# Patient Record
Sex: Male | Born: 1970 | Race: White | Hispanic: No | Smoking: Never smoker
Health system: Southern US, Community
[De-identification: ages and names within clinical notes are randomized; demographics above are authoritative.]

## PROBLEM LIST (undated history)

## (undated) DIAGNOSIS — J45909 Unspecified asthma, uncomplicated: Secondary | ICD-10-CM

## (undated) DIAGNOSIS — J449 Chronic obstructive pulmonary disease, unspecified: Secondary | ICD-10-CM

## (undated) DIAGNOSIS — I10 Essential (primary) hypertension: Secondary | ICD-10-CM

---

## 2001-03-29 ENCOUNTER — Emergency Department (HOSPITAL_COMMUNITY): Admission: EM | Admit: 2001-03-29 | Discharge: 2001-03-29 | Payer: Self-pay | Admitting: Emergency Medicine

## 2003-12-29 ENCOUNTER — Emergency Department (HOSPITAL_COMMUNITY): Admission: EM | Admit: 2003-12-29 | Discharge: 2003-12-29 | Payer: Self-pay | Admitting: Emergency Medicine

## 2004-12-11 ENCOUNTER — Emergency Department (HOSPITAL_COMMUNITY): Admission: EM | Admit: 2004-12-11 | Discharge: 2004-12-11 | Payer: Self-pay | Admitting: Emergency Medicine

## 2006-09-04 ENCOUNTER — Emergency Department (HOSPITAL_COMMUNITY): Admission: EM | Admit: 2006-09-04 | Discharge: 2006-09-04 | Payer: Self-pay | Admitting: Emergency Medicine

## 2010-01-23 ENCOUNTER — Emergency Department (HOSPITAL_COMMUNITY): Admission: EM | Admit: 2010-01-23 | Discharge: 2010-01-23 | Payer: Self-pay | Admitting: Emergency Medicine

## 2010-07-05 ENCOUNTER — Emergency Department (HOSPITAL_COMMUNITY)
Admission: EM | Admit: 2010-07-05 | Discharge: 2010-07-05 | Payer: Self-pay | Source: Home / Self Care | Admitting: Emergency Medicine

## 2017-03-22 ENCOUNTER — Encounter (HOSPITAL_COMMUNITY): Payer: Self-pay | Admitting: Family Medicine

## 2017-03-22 ENCOUNTER — Ambulatory Visit (HOSPITAL_COMMUNITY)
Admission: EM | Admit: 2017-03-22 | Discharge: 2017-03-22 | Disposition: A | Discharge status: Home / Self Care | Payer: Self-pay | Attending: Internal Medicine | Admitting: Internal Medicine

## 2017-03-22 DIAGNOSIS — W57XXXA Bitten or stung by nonvenomous insect and other nonvenomous arthropods, initial encounter: Secondary | ICD-10-CM

## 2017-03-22 DIAGNOSIS — S30860A Insect bite (nonvenomous) of lower back and pelvis, initial encounter: Secondary | ICD-10-CM

## 2017-03-22 HISTORY — DX: Unspecified asthma, uncomplicated: J45.909

## 2017-03-22 HISTORY — DX: Chronic obstructive pulmonary disease, unspecified: J44.9

## 2017-03-22 MED ORDER — DOXYCYCLINE HYCLATE 100 MG PO TABS
200.0000 mg | ORAL_TABLET | Freq: Once | ORAL | Status: AC
  Administered 2017-03-22: 200 mg via ORAL

## 2017-03-22 MED ORDER — DOXYCYCLINE HYCLATE 100 MG PO TABS
ORAL_TABLET | ORAL | Status: AC
  Filled 2017-03-22: qty 2

## 2017-03-22 NOTE — ED Provider Notes (Signed)
CSN: [id number]     Arrival date & time 03/22/17  1039 History   None    Chief Complaint  Patient presents with  . Insect Bite   (Consider location/radiation/quality/duration/timing/severity/associated sxs/prior Treatment) 46 year old male comes in with 2 day history of a tick bite on the back. He states that he felt a bump on his back 4 days ago that is pruritic. 3 days ago his mother checked his back and realized it was a tick attached. Mother removed tick with tweezers. The patient states that he thinks the tick has been on for 24 hours. Patient states he's been having some local reaction to the tick bite with redness and swelling. Denies any fever, chills, night sweats. Denies malaise, body aches, joint pain. Denies nausea, vomiting, and diarrhea. Denies rash.       Past Medical History:  Diagnosis Date  . Asthma   . COPD (chronic obstructive pulmonary disease) (HCC)    History reviewed. No pertinent surgical history. History reviewed. No pertinent family history. Social History  Substance Use Topics  . Smoking status: Never Smoker  . Smokeless tobacco: Never Used  . Alcohol use Not on file    Review of Systems  Constitutional: Negative for chills, diaphoresis, fatigue and fever.  HENT: Negative for congestion, rhinorrhea, sinus pain, sinus pressure and sore throat.   Eyes: Negative for pain, discharge, redness and itching.  Respiratory: Negative for cough, shortness of breath and wheezing.   Cardiovascular: Negative for chest pain and palpitations.  Gastrointestinal: Negative for abdominal pain, diarrhea, nausea and vomiting.  Musculoskeletal: Negative for arthralgias and myalgias.  Skin: Negative for rash.  Neurological: Negative for dizziness, tremors, weakness, light-headedness, numbness and headaches.    Allergies  Patient has no known allergies.  Home Medications   Prior to Admission medications   Not on File   Meds Ordered and Administered this Visit    Medications  doxycycline (VIBRA-TABS) tablet 200 mg (not administered)    BP 119/74   Pulse 81   Temp 98.5 F (36.9 C)   Resp 18   SpO2 95%  No data found.   Physical Exam  Constitutional: He is oriented to person, place, and time. He appears well-developed and well-nourished. No distress.  HENT:  Head: Normocephalic and atraumatic.  Eyes: Conjunctivae are normal. Pupils are equal, round, and reactive to light.  Cardiovascular: Normal rate, regular rhythm and normal heart sounds.  Exam reveals no gallop and no friction rub.   No murmur heard. Pulmonary/Chest: Effort normal and breath sounds normal. He has no wheezes. He has no rales.  Lymphadenopathy:    He has no cervical adenopathy.  Neurological: He is alert and oriented to person, place, and time.  Skin: Skin is warm and dry.  Psychiatric: He has a normal mood and affect. His behavior is normal. Judgment normal.    Urgent Care Course     Procedures (including critical care time)  Labs Review Labs Reviewed - No data to display  Imaging Review No results found.      MDM   1. Tick bite, initial encounter    1. Discussed with patient incubation period of tick disease. Given incubation period, lab draws is not recommended at this time. Given the patient with at least 24 hours of tick bite attachment, with possible engorged tick, will treat with prophylactic dose of doxycycline 200 mg one dose. Patient to monitor for any symptom changes such as fever, malaise, body aches, joint pain, abdominal pain, or rash. Patient  to follow up with PCP if experiencing any of the symptoms for further workup.    Belinda Fisher, PA-C 03/22/17 1141

## 2017-03-22 NOTE — ED Triage Notes (Signed)
Pt here for tick bite. sts he feels okay but worried about the type of tick .

## 2018-09-29 ENCOUNTER — Encounter: Payer: Self-pay | Admitting: Cardiology

## 2018-09-29 ENCOUNTER — Ambulatory Visit (INDEPENDENT_AMBULATORY_CARE_PROVIDER_SITE_OTHER): Payer: Medicare Other | Admitting: Cardiology

## 2018-09-29 VITALS — BP 126/60 | HR 76 | Ht 71.0 in | Wt 390.7 lb

## 2018-09-29 DIAGNOSIS — E088 Diabetes mellitus due to underlying condition with unspecified complications: Secondary | ICD-10-CM

## 2018-09-29 DIAGNOSIS — I10 Essential (primary) hypertension: Secondary | ICD-10-CM

## 2018-09-29 DIAGNOSIS — I209 Angina pectoris, unspecified: Secondary | ICD-10-CM | POA: Diagnosis not present

## 2018-09-29 HISTORY — DX: Essential (primary) hypertension: I10

## 2018-09-29 HISTORY — DX: Morbid (severe) obesity due to excess calories: E66.01

## 2018-09-29 HISTORY — DX: Diabetes mellitus due to underlying condition with unspecified complications: E08.8

## 2018-09-29 MED ORDER — NITROGLYCERIN 0.4 MG SL SUBL: 0.4000 mg | SUBLINGUAL_TABLET | SUBLINGUAL | 11 refills | Status: DC | PRN

## 2018-09-29 NOTE — H&P (View-Only) (Signed)
Cardiology Office Note:    Date:  09/29/2018   ID:  Charles Esparza, DOB October 19, 1970, MRN [id number]  PCP:  Charles Esparza, Charles Edward, MD  Cardiologist:  Charles Brothersajan R Henlee Donovan, MD   Referring MD: Charles Esparza, Charles Esparza,*    ASSESSMENT:    1. Angina pectoris (HCC)   2. Morbid obesity (HCC)   3. Essential hypertension   4. Diabetes mellitus due to underlying condition with unspecified complications (HCC)    PLAN:    In order of problems listed above:  1. Primary prevention stressed with the patient.  Importance of compliance with diet and medications stressed with the patient.  His blood pressure is stable and diet was discussed for dyslipidemia and diabetes mellitus.  Importance of diet stressed.  Risks of obesity explained at extensive length and he vocalized understanding. 2. His symptoms are very concerning.In view of the patient's symptoms, I discussed with the patient options for evaluation. Invasive and noninvasive options were given to the patient. I discussed stress testing and coronary angiography and left heart catheterization at length. Benefits, pros and cons of each approach were discussed at length. Patient had multiple questions which were answered to the patient's satisfaction. Patient opted for invasive evaluation and we will set up for coronary angiography and left heart catheterization. Further recommendations will be made based on the findings with coronary angiography. In the interim if the patient has any significant symptoms in hospital to the nearest emergency room. 3. Sublingual nitroglycerin prescription was sent, its protocol and 911 protocol explained and the patient vocalized understanding questions were answered to the patient's satisfaction   Medication Adjustments/Labs and Tests Ordered: Current medicines are reviewed at length with the patient today.  Concerns regarding medicines are outlined above.  No orders of the defined types were placed in this  encounter.  No orders of the defined types were placed in this encounter.    History of Present Illness:    Charles Esparza is a 48 y.o. male who is being seen today for the evaluation of chest tightness on exertion at the request of Charles Miyamotoerry, Charles Esparza,*.  Patient is a pleasant 48 year old male.  He has past medical history of essential hypertension and is morbidly obese.  The patient mentions to me that he has chest tightness on exertion.  This goes to the neck and occasionally to the arms.  He leads a sedentary lifestyle.  He mentions to me that when he is sexually active that he has similar symptoms.  No orthopnea or PND.  Because of the symptoms he is here for an evaluation.  His significant other is with him during this visit and agrees.  At the time of my evaluation, the patient is alert awake oriented and in no distress.  Past Medical History:  Diagnosis Date  . Asthma   . COPD (chronic obstructive pulmonary disease) (HCC)     History reviewed. No pertinent surgical history.  Current Medications: Current Meds  Medication Sig  . aspirin EC 81 MG tablet Take 81 mg by mouth daily.  . furosemide (LASIX) 20 MG tablet Take 20 mg by mouth daily.  Marland Kitchen. lisinopril (PRINIVIL,ZESTRIL) 20 MG tablet Take 1 tablet by mouth daily.  Marland Kitchen. LORazepam (ATIVAN) 0.5 MG tablet Take 0.5 mg by mouth every 8 (eight) hours.  . metoprolol tartrate (LOPRESSOR) 50 MG tablet Take 1 tablet by mouth 2 (two) times daily.     Allergies:   Patient has no known allergies.   Social History   Socioeconomic  History  . Marital status: Married    Spouse name: Not on file  . Number of children: Not on file  . Years of education: Not on file  . Highest education level: Not on file  Occupational History  . Not on file  Social Needs  . Financial resource strain: Not on file  . Food insecurity:    Worry: Not on file    Inability: Not on file  . Transportation needs:    Medical: Not on file    Non-medical: Not  on file  Tobacco Use  . Smoking status: Never Smoker  . Smokeless tobacco: Never Used  Substance and Sexual Activity  . Alcohol use: Not on file  . Drug use: Not on file  . Sexual activity: Not on file  Lifestyle  . Physical activity:    Days per week: Not on file    Minutes per session: Not on file  . Stress: Not on file  Relationships  . Social connections:    Talks on phone: Not on file    Gets together: Not on file    Attends religious service: Not on file    Active member of club or organization: Not on file    Attends meetings of clubs or organizations: Not on file    Relationship status: Not on file  Other Topics Concern  . Not on file  Social History Narrative  . Not on file     Family History: The patient's family history is not on file.  ROS:   Please see the history of present illness.    All other systems reviewed and are negative.  EKGs/Labs/Other Studies Reviewed:    The following studies were reviewed today: I discussed my findings with the patient.  EKG reveals sinus rhythm and nonspecific ST-T changes.   Recent Labs: No results found for requested labs within last 8760 hours.  Recent Lipid Panel No results found for: CHOL, TRIG, HDL, CHOLHDL, VLDL, LDLCALC, LDLDIRECT  Physical Exam:    VS:  BP 126/60 (BP Location: Right Arm, Patient Position: Sitting, Cuff Size: Normal)   Pulse 76   Ht 5\' 11"  (1.803 m)   Wt (!) 390 lb 11.2 oz (177.2 kg)   SpO2 98%   BMI 54.49 kg/m     Wt Readings from Last 3 Encounters:  09/29/18 (!) 390 lb 11.2 oz (177.2 kg)     GEN: Patient is in no acute distress HEENT: Normal NECK: No JVD; No carotid bruits LYMPHATICS: No lymphadenopathy CARDIAC: S1 S2 regular, 2/6 systolic murmur at the apex. RESPIRATORY:  Clear to auscultation without rales, wheezing or rhonchi  ABDOMEN: Soft, non-tender, non-distended MUSCULOSKELETAL:  No edema; No deformity  SKIN: Warm and dry NEUROLOGIC:  Alert and oriented x  3 PSYCHIATRIC:  Normal affect    Signed, Charles Brothers, MD  09/29/2018 3:35 PM    [facility]

## 2018-09-29 NOTE — Patient Instructions (Signed)
Medication Instructions:   Your physician has recommended you make the following change in your medication:   TAKE AS NEEDED FOR CHEST PAIN.   When having chest pain, stop what you are doing and sit down. Take 1 nitro, wait 5 minutes. Still having chest pain, take 1 nitro, wait 5 minutes. Still having chest pain, take 1 nitro, dial 911. Total of 3 nitro in 15 minutes.    If you need a refill on your cardiac medications before your next appointment, please call your pharmacy.   Lab work: Your physician recommends that you return for lab work today: CBC, BMP  If you have labs (blood work) drawn today and your tests are completely normal, you will receive your results only by: Marland Kitchen. MyChart Message (if you have MyChart) OR . A paper copy in the mail If you have any lab test that is abnormal or we need to change your treatment, we will call you to review the results.  Testing/Procedures:  A chest x-ray takes a picture of the organs and structures inside the chest, including the heart, lungs, and blood vessels. This test can show several things, including, whether the heart is enlarges; whether fluid is building up in the lungs; and whether pacemaker / defibrillator leads are still in place.    [facility] [place] [address] [place] [id number] Dept: [phone] Loc: [phone]  Denna HaggardRaymond P Swoboda  09/29/2018  You are scheduled for a Cardiac Catheterization on Thursday, January 16 with Dr. Cristal Deerhristopher End.  1. [facility]: [address] [place], [place] [address] at 7:00 AM (This time is two hours before your procedure to ensure your preparation). Free valet parking service is available.   Special note: Every effort is made to have your procedure done on time. Please understand that emergencies sometimes delay scheduled procedures.  2. Diet: Do not  eat solid foods after midnight.  The patient may have clear liquids until 5am upon the day of the procedure.  3. Labs: You will need to have blood drawn today.  4. Medication instructions in preparation for your procedure:   Contrast Allergy: No    HOLD LASIX THE DAY OF PROCEDURE.     On the morning of your procedure, take your Aspirin and any morning medicines NOT listed above.  You may use sips of water.  5. Plan for one night stay--bring personal belongings. 6. Bring a current list of your medications and current insurance cards. 7. You MUST have a responsible person to drive you home. 8. Someone MUST be with you the first 24 hours after you arrive home or your discharge will be delayed. 9. Please wear clothes that are easy to get on and off and wear slip-on shoes.  Thank you for allowing us to care for you!   -- [facility]   Follow-Up: At [facility], you and your health needs are our priority.  As part of our continuing mission to provide you with exceptional heart care, we have created designated Provider Care Teams.  These Care Teams include your primary Cardiologist (physician) and Advanced Practice Providers (APPs -  Physician Assistants and Nurse Practitioners) who all work together to provide you with the care you need, when you need it.  . You  will need a follow up appointment in 1 months.    Any Other Special Instructions Will  Be Listed Below (If Applicable).  Coronary Angiogram With Stent Coronary angiogram with stent placement is a procedure to widen or open a narrow blood vessel of the heart (coronary artery). Arteries may become blocked by cholesterol buildup (plaques) in the lining of the wall. When a coronary artery becomes partially blocked, blood flow to that area decreases. This may lead to chest pain or a heart attack (myocardial infarction). A stent is a small piece of metal that looks like mesh or a spring. Stent  placement may be done as treatment for a heart attack or right after a coronary angiogram in which a blocked artery is found. Let your health care provider know about:  Any allergies you have.  All medicines you are taking, including vitamins, herbs, eye drops, creams, and over-the-counter medicines.  Any problems you or family members have had with anesthetic medicines.  Any blood disorders you have.  Any surgeries you have had.  Any medical conditions you have.  Whether you are pregnant or may be pregnant. What are the risks? Generally, this is a safe procedure. However, problems may occur, including:  Damage to the heart or its blood vessels.  A return of blockage.  Bleeding, infection, or bruising at the insertion site.  A collection of blood under the skin (hematoma) at the insertion site.  A blood clot in another part of the body.  Kidney injury.  Allergic reaction to the dye or contrast that is used.  Bleeding into the abdomen (retroperitoneal bleeding). What happens before the procedure? Staying hydrated Follow instructions from your health care provider about hydration, which may include:  Up to 2 hours before the procedure - you may continue to drink clear liquids, such as water, clear fruit juice, black coffee, and plain tea.  Eating and drinking restrictions Follow instructions from your health care provider about eating and drinking, which may include:  8 hours before the procedure - stop eating heavy meals or foods such as meat, fried foods, or fatty foods.  6 hours before the procedure - stop eating light meals or foods, such as toast or cereal.  2 hours before the procedure - stop drinking clear liquids. Ask your health care provider about:  Changing or stopping your regular medicines. This is especially important if you are taking diabetes medicines or blood thinners.  Taking medicines such as ibuprofen. These medicines can thin your blood. Do not  take these medicines before your procedure if your health care provider instructs you not to. Generally, aspirin is recommended before a procedure of passing a small, thin tube (catheter) through a blood vessel and into the heart (cardiac catheterization). What happens during the procedure?   An IV tube will be inserted into one of your veins.  You will be given one or more of the following: ? A medicine to help you relax (sedative). ? A medicine to numb the area where the catheter will be inserted into an artery (local anesthetic).  To reduce your risk of infection: ? Your health care team will wash or sanitize their hands. ? Your skin will be washed with soap. ? Hair may be removed from the area where the catheter will be inserted.  Using a guide wire, the catheter will be inserted into an artery. The location may be in your groin, in your wrist, or in the fold of your arm (near your elbow).  A type of X-ray (fluoroscopy) will be used to help guide the catheter to the opening of the  arteries in the heart.  A dye will be injected into the catheter, and X-rays will be taken. The dye will help to show where any narrowing or blockages are located in the arteries.  A tiny wire will be guided to the blocked spot, and a balloon will be inflated to make the artery wider.  The stent will be expanded and will crush the plaques into the wall of the vessel. The stent will hold the area open and improve the blood flow. Most stents have a drug coating to reduce the risk of the stent narrowing over time.  The artery may be made wider using a drill, laser, or other tools to remove plaques.  When the blood flow is better, the catheter will be removed. The lining of the artery will grow over the stent, which stays where it was placed. This procedure may vary among health care providers and hospitals. What happens after the procedure?  If the procedure is done through the leg, you will be kept in bed  lying flat for about 6 hours. You will be instructed to not bend and not cross your legs.  The insertion site will be checked frequently.  The pulse in your foot or wrist will be checked frequently.  You may have additional blood tests, X-rays, and a test that records the electrical activity of your heart (electrocardiogram, or ECG). This information is not intended to replace advice given to you by your health care provider. Make sure you discuss any questions you have with your health care provider. Document Released: 03/10/2003 Document Revised: 12/13/2017 Document Reviewed: 04/08/2016 Elsevier Interactive Patient Education  2019 Elsevier Inc.  Nitroglycerin sublingual tablets What is this medicine? NITROGLYCERIN (nye troe GLI ser in) is a type of vasodilator. It relaxes blood vessels, increasing the blood and oxygen supply to your heart. This medicine is used to relieve chest pain caused by angina. It is also used to prevent chest pain before activities like climbing stairs, going outdoors in cold weather, or sexual activity. This medicine may be used for other purposes; ask your health care provider or pharmacist if you have questions. COMMON BRAND NAME(S): Nitroquick, Nitrostat, Nitrotab What should I tell my health care provider before I take this medicine? They need to know if you have any of these conditions: -anemia -head injury, recent stroke, or bleeding in the brain -liver disease -previous heart attack -an unusual or allergic reaction to nitroglycerin, other medicines, foods, dyes, or preservatives -pregnant or trying to get pregnant -breast-feeding How should I use this medicine? Take this medicine by mouth as needed. At the first sign of an angina attack (chest pain or tightness) place one tablet under your tongue. You can also take this medicine 5 to 10 minutes before an event likely to produce chest pain. Follow the directions on the prescription label. Let the tablet  dissolve under the tongue. Do not swallow whole. Replace the dose if you accidentally swallow it. It will help if your mouth is not dry. Saliva around the tablet will help it to dissolve more quickly. Do not eat or drink, smoke or chew tobacco while a tablet is dissolving. If you are not better within 5 minutes after taking ONE dose of nitroglycerin, call 9-1-1 immediately to seek emergency medical care. Do not take more than 3 nitroglycerin tablets over 15 minutes. If you take this medicine often to relieve symptoms of angina, your doctor or health care professional may provide you with different instructions to manage your  symptoms. If symptoms do not go away after following these instructions, it is important to call 9-1-1 immediately. Do not take more than 3 nitroglycerin tablets over 15 minutes. Talk to your pediatrician regarding the use of this medicine in children. Special care may be needed. Overdosage: If you think you have taken too much of this medicine contact a poison control center or emergency room at once. NOTE: This medicine is only for you. Do not share this medicine with others. What if I miss a dose? This does not apply. This medicine is only used as needed. What may interact with this medicine? Do not take this medicine with any of the following medications: -certain migraine medicines like ergotamine and dihydroergotamine (DHE) -medicines used to treat erectile dysfunction like sildenafil, tadalafil, and vardenafil -riociguat This medicine may also interact with the following medications: -alteplase -aspirin -heparin -medicines for high blood pressure -medicines for mental depression -other medicines used to treat angina -phenothiazines like chlorpromazine, mesoridazine, prochlorperazine, thioridazine This list may not describe all possible interactions. Give your health care provider a list of all the medicines, herbs, non-prescription drugs, or dietary supplements you  use. Also tell them if you smoke, drink alcohol, or use illegal drugs. Some items may interact with your medicine. What should I watch for while using this medicine? Tell your doctor or health care professional if you feel your medicine is no longer working. Keep this medicine with you at all times. Sit or lie down when you take your medicine to prevent falling if you feel dizzy or faint after using it. Try to remain calm. This will help you to feel better faster. If you feel dizzy, take several deep breaths and lie down with your feet propped up, or bend forward with your head resting between your knees. You may get drowsy or dizzy. Do not drive, use machinery, or do anything that needs mental alertness until you know how this drug affects you. Do not stand or sit up quickly, especially if you are an older patient. This reduces the risk of dizzy or fainting spells. Alcohol can make you more drowsy and dizzy. Avoid alcoholic drinks. Do not treat yourself for coughs, colds, or pain while you are taking this medicine without asking your doctor or health care professional for advice. Some ingredients may increase your blood pressure. What side effects may I notice from receiving this medicine? Side effects that you should report to your doctor or health care professional as soon as possible: -blurred vision -dry mouth -skin rash -sweating -the feeling of extreme pressure in the head -unusually weak or tired Side effects that usually do not require medical attention (report to your doctor or health care professional if they continue or are bothersome): -flushing of the face or neck -headache -irregular heartbeat, palpitations -nausea, vomiting This list may not describe all possible side effects. Call your doctor for medical advice about side effects. You may report side effects to FDA at 1-800-FDA-1088. Where should I keep my medicine? Keep out of the reach of children. Store at room temperature  between 20 and 25 degrees C (68 and 77 degrees F). Store in Retail buyeroriginal container. Protect from light and moisture. Keep tightly closed. Throw away any unused medicine after the expiration date. NOTE: This sheet is a summary. It may not cover all possible information. If you have questions about this medicine, talk to your doctor, pharmacist, or health care provider.  2019 Elsevier/Gold Standard (2013-07-02 17:57:36)

## 2018-09-29 NOTE — Progress Notes (Signed)
Cardiology Office Note:    Date:  09/29/2018   ID:  Charles Esparza, DOB 05/22/1971, MRN [id number]  PCP:  Perry, Lawrence Edward, MD  Cardiologist:  Rajan R Revankar, MD   Referring MD: Perry, Lawrence Edward,*    ASSESSMENT:    1. Angina pectoris (HCC)   2. Morbid obesity (HCC)   3. Essential hypertension   4. Diabetes mellitus due to underlying condition with unspecified complications (HCC)    PLAN:    In order of problems listed above:  1. Primary prevention stressed with the patient.  Importance of compliance with diet and medications stressed with the patient.  His blood pressure is stable and diet was discussed for dyslipidemia and diabetes mellitus.  Importance of diet stressed.  Risks of obesity explained at extensive length and he vocalized understanding. 2. His symptoms are very concerning.In view of the patient's symptoms, I discussed with the patient options for evaluation. Invasive and noninvasive options were given to the patient. I discussed stress testing and coronary angiography and left heart catheterization at length. Benefits, pros and cons of each approach were discussed at length. Patient had multiple questions which were answered to the patient's satisfaction. Patient opted for invasive evaluation and we will set up for coronary angiography and left heart catheterization. Further recommendations will be made based on the findings with coronary angiography. In the interim if the patient has any significant symptoms in hospital to the nearest emergency room. 3. Sublingual nitroglycerin prescription was sent, its protocol and 911 protocol explained and the patient vocalized understanding questions were answered to the patient's satisfaction   Medication Adjustments/Labs and Tests Ordered: Current medicines are reviewed at length with the patient today.  Concerns regarding medicines are outlined above.  No orders of the defined types were placed in this  encounter.  No orders of the defined types were placed in this encounter.    History of Present Illness:    Charles Esparza is a 48 y.o. male who is being seen today for the evaluation of chest tightness on exertion at the request of Perry, Lawrence Edward,*.  Patient is a pleasant 48-year-old male.  He has past medical history of essential hypertension and is morbidly obese.  The patient mentions to me that he has chest tightness on exertion.  This goes to the neck and occasionally to the arms.  He leads a sedentary lifestyle.  He mentions to me that when he is sexually active that he has similar symptoms.  No orthopnea or PND.  Because of the symptoms he is here for an evaluation.  His significant other is with him during this visit and agrees.  At the time of my evaluation, the patient is alert awake oriented and in no distress.  Past Medical History:  Diagnosis Date  . Asthma   . COPD (chronic obstructive pulmonary disease) (HCC)     History reviewed. No pertinent surgical history.  Current Medications: Current Meds  Medication Sig  . aspirin EC 81 MG tablet Take 81 mg by mouth daily.  . furosemide (LASIX) 20 MG tablet Take 20 mg by mouth daily.  . lisinopril (PRINIVIL,ZESTRIL) 20 MG tablet Take 1 tablet by mouth daily.  . LORazepam (ATIVAN) 0.5 MG tablet Take 0.5 mg by mouth every 8 (eight) hours.  . metoprolol tartrate (LOPRESSOR) 50 MG tablet Take 1 tablet by mouth 2 (two) times daily.     Allergies:   Patient has no known allergies.   Social History   Socioeconomic   History  . Marital status: Married    Spouse name: Not on file  . Number of children: Not on file  . Years of education: Not on file  . Highest education level: Not on file  Occupational History  . Not on file  Social Needs  . Financial resource strain: Not on file  . Food insecurity:    Worry: Not on file    Inability: Not on file  . Transportation needs:    Medical: Not on file    Non-medical: Not  on file  Tobacco Use  . Smoking status: Never Smoker  . Smokeless tobacco: Never Used  Substance and Sexual Activity  . Alcohol use: Not on file  . Drug use: Not on file  . Sexual activity: Not on file  Lifestyle  . Physical activity:    Days per week: Not on file    Minutes per session: Not on file  . Stress: Not on file  Relationships  . Social connections:    Talks on phone: Not on file    Gets together: Not on file    Attends religious service: Not on file    Active member of club or organization: Not on file    Attends meetings of clubs or organizations: Not on file    Relationship status: Not on file  Other Topics Concern  . Not on file  Social History Narrative  . Not on file     Family History: The patient's family history is not on file.  ROS:   Please see the history of present illness.    All other systems reviewed and are negative.  EKGs/Labs/Other Studies Reviewed:    The following studies were reviewed today: I discussed my findings with the patient.  EKG reveals sinus rhythm and nonspecific ST-T changes.   Recent Labs: No results found for requested labs within last 8760 hours.  Recent Lipid Panel No results found for: CHOL, TRIG, HDL, CHOLHDL, VLDL, LDLCALC, LDLDIRECT  Physical Exam:    VS:  BP 126/60 (BP Location: Right Arm, Patient Position: Sitting, Cuff Size: Normal)   Pulse 76   Ht 5\' 11"  (1.803 m)   Wt (!) 390 lb 11.2 oz (177.2 kg)   SpO2 98%   BMI 54.49 kg/m     Wt Readings from Last 3 Encounters:  09/29/18 (!) 390 lb 11.2 oz (177.2 kg)     GEN: Patient is in no acute distress HEENT: Normal NECK: No JVD; No carotid bruits LYMPHATICS: No lymphadenopathy CARDIAC: S1 S2 regular, 2/6 systolic murmur at the apex. RESPIRATORY:  Clear to auscultation without rales, wheezing or rhonchi  ABDOMEN: Soft, non-tender, non-distended MUSCULOSKELETAL:  No edema; No deformity  SKIN: Warm and dry NEUROLOGIC:  Alert and oriented x  3 PSYCHIATRIC:  Normal affect    Signed, Garwin Brothers, MD  09/29/2018 3:35 PM    [facility]

## 2018-09-30 LAB — BASIC METABOLIC PANEL
BUN/Creatinine Ratio: 28 — ABNORMAL HIGH (ref 9–20)
BUN: 21 mg/dL (ref 6–24)
CALCIUM: 9.7 mg/dL (ref 8.7–10.2)
CO2: 27 mmol/L (ref 20–29)
CREATININE: 0.76 mg/dL (ref 0.76–1.27)
Chloride: 100 mmol/L (ref 96–106)
GFR calc Af Amer: 124 mL/min/{1.73_m2} (ref >59)
GFR, EST NON AFRICAN AMERICAN: 107 mL/min/{1.73_m2} (ref >59)
Glucose: 123 mg/dL — ABNORMAL HIGH (ref 65–99)
Potassium: 4.4 mmol/L (ref 3.5–5.2)
Sodium: 141 mmol/L (ref 134–144)

## 2018-09-30 LAB — CBC
HEMATOCRIT: 37.7 % (ref 37.5–51.0)
HEMOGLOBIN: 12.7 g/dL — AB (ref 13.0–17.7)
MCH: 27.4 pg (ref 26.6–33.0)
MCHC: 33.7 g/dL (ref 31.5–35.7)
MCV: 81 fL (ref 79–97)
Platelets: 264 10*3/uL (ref 150–450)
RBC: 4.64 x10E6/uL (ref 4.14–5.80)
RDW: 13.6 % (ref 11.6–15.4)
WBC: 8.3 10*3/uL (ref 3.4–10.8)

## 2018-10-01 ENCOUNTER — Telehealth: Payer: Self-pay | Admitting: *Deleted

## 2018-10-01 NOTE — Telephone Encounter (Signed)
Pt contacted pre-catheterization scheduled at [facility] for: Thursday October 02, 2018 9 AM Verified arrival time and place: [facility] Main Entrance A at: 7 AM  No solid food after midnight prior to cath, clear liquids until 5 AM day of procedure. Contrast allergy: no Verified no diabetes medications.  Hold: Furosemide-AM of procedure. Sildenafil-until post procedure.  Except hold medications AM meds can be  taken pre-cath with sip of water including: ASA 81 mg  Confirmed patient has responsible person to drive home post procedure and for 24 hours after you arrive home: yes

## 2018-10-02 ENCOUNTER — Ambulatory Visit (HOSPITAL_COMMUNITY)
Admission: RE | Admit: 2018-10-02 | Discharge: 2018-10-02 | Disposition: A | Discharge status: Home / Self Care | Payer: Medicare Other | Attending: Internal Medicine | Admitting: Internal Medicine

## 2018-10-02 ENCOUNTER — Other Ambulatory Visit: Payer: Self-pay

## 2018-10-02 ENCOUNTER — Encounter (HOSPITAL_COMMUNITY)
Admission: RE | Disposition: A | Discharge status: Home / Self Care | Payer: Self-pay | Source: Home / Self Care | Attending: Internal Medicine

## 2018-10-02 DIAGNOSIS — Z79899 Other long term (current) drug therapy: Secondary | ICD-10-CM | POA: Diagnosis not present

## 2018-10-02 DIAGNOSIS — Z7982 Long term (current) use of aspirin: Secondary | ICD-10-CM | POA: Insufficient documentation

## 2018-10-02 DIAGNOSIS — I209 Angina pectoris, unspecified: Secondary | ICD-10-CM | POA: Diagnosis not present

## 2018-10-02 DIAGNOSIS — I10 Essential (primary) hypertension: Secondary | ICD-10-CM

## 2018-10-02 DIAGNOSIS — E088 Diabetes mellitus due to underlying condition with unspecified complications: Secondary | ICD-10-CM

## 2018-10-02 DIAGNOSIS — Z6841 Body Mass Index (BMI) 40.0 and over, adult: Secondary | ICD-10-CM | POA: Diagnosis not present

## 2018-10-02 DIAGNOSIS — R0609 Other forms of dyspnea: Secondary | ICD-10-CM

## 2018-10-02 DIAGNOSIS — E119 Type 2 diabetes mellitus without complications: Secondary | ICD-10-CM | POA: Insufficient documentation

## 2018-10-02 DIAGNOSIS — E785 Hyperlipidemia, unspecified: Secondary | ICD-10-CM | POA: Diagnosis not present

## 2018-10-02 HISTORY — DX: Other forms of dyspnea: R06.09

## 2018-10-02 HISTORY — PX: LEFT HEART CATH AND CORONARY ANGIOGRAPHY: CATH118249

## 2018-10-02 SURGERY — LEFT HEART CATH AND CORONARY ANGIOGRAPHY
Anesthesia: LOCAL

## 2018-10-02 MED ORDER — VERAPAMIL HCL 2.5 MG/ML IV SOLN
INTRAVENOUS | Status: DC | PRN
  Administered 2018-10-02: 10 mL via INTRA_ARTERIAL

## 2018-10-02 MED ORDER — FUROSEMIDE 40 MG PO TABS: 40.0000 mg | ORAL_TABLET | Freq: Every day | ORAL | 2 refills | Status: DC

## 2018-10-02 MED ORDER — HEPARIN (PORCINE) IN NACL 1000-0.9 UT/500ML-% IV SOLN
INTRAVENOUS | Status: AC
  Filled 2018-10-02: qty 500

## 2018-10-02 MED ORDER — HEPARIN SODIUM (PORCINE) 1000 UNIT/ML IJ SOLN
INTRAMUSCULAR | Status: DC | PRN
  Administered 2018-10-02: 5000 [IU] via INTRAVENOUS

## 2018-10-02 MED ORDER — MIDAZOLAM HCL 2 MG/2ML IJ SOLN
INTRAMUSCULAR | Status: DC | PRN
  Administered 2018-10-02: 1 mg via INTRAVENOUS

## 2018-10-02 MED ORDER — SODIUM CHLORIDE 0.9 % WEIGHT BASED INFUSION
3.0000 mL/kg/h | INTRAVENOUS | Status: AC
  Administered 2018-10-02: 3 mL/kg/h via INTRAVENOUS

## 2018-10-02 MED ORDER — SODIUM CHLORIDE 0.9 % WEIGHT BASED INFUSION: 1.0000 mL/kg/h | INTRAVENOUS | Status: DC

## 2018-10-02 MED ORDER — LIDOCAINE HCL (PF) 1 % IJ SOLN
INTRAMUSCULAR | Status: AC
  Filled 2018-10-02: qty 30

## 2018-10-02 MED ORDER — SODIUM CHLORIDE 0.9% FLUSH: 3.0000 mL | Freq: Two times a day (BID) | INTRAVENOUS | Status: DC

## 2018-10-02 MED ORDER — SODIUM CHLORIDE 0.9 % IV SOLN: 250.0000 mL | INTRAVENOUS | Status: DC | PRN

## 2018-10-02 MED ORDER — LIDOCAINE HCL (PF) 1 % IJ SOLN
INTRAMUSCULAR | Status: DC | PRN
  Administered 2018-10-02: 2 mL

## 2018-10-02 MED ORDER — FENTANYL CITRATE (PF) 100 MCG/2ML IJ SOLN
INTRAMUSCULAR | Status: AC
  Filled 2018-10-02: qty 2

## 2018-10-02 MED ORDER — HEPARIN (PORCINE) IN NACL 1000-0.9 UT/500ML-% IV SOLN
INTRAVENOUS | Status: DC | PRN
  Administered 2018-10-02 (×3): 500 mL

## 2018-10-02 MED ORDER — IOHEXOL 350 MG/ML SOLN
INTRAVENOUS | Status: DC | PRN
  Administered 2018-10-02: 115 mL via INTRACARDIAC

## 2018-10-02 MED ORDER — FENTANYL CITRATE (PF) 100 MCG/2ML IJ SOLN
INTRAMUSCULAR | Status: DC | PRN
  Administered 2018-10-02: 25 ug via INTRAVENOUS

## 2018-10-02 MED ORDER — MIDAZOLAM HCL 2 MG/2ML IJ SOLN
INTRAMUSCULAR | Status: AC
  Filled 2018-10-02: qty 2

## 2018-10-02 MED ORDER — SODIUM CHLORIDE 0.9% FLUSH: 3.0000 mL | INTRAVENOUS | Status: DC | PRN

## 2018-10-02 MED ORDER — ASPIRIN 81 MG PO CHEW: 81.0000 mg | CHEWABLE_TABLET | ORAL | Status: DC

## 2018-10-02 MED ORDER — VERAPAMIL HCL 2.5 MG/ML IV SOLN
INTRAVENOUS | Status: AC
  Filled 2018-10-02: qty 2

## 2018-10-02 SURGICAL SUPPLY — 14 items
CATH 5FR JL3.5 JR4 ANG PIG MP (CATHETERS) ×1 IMPLANT
CATH LAUNCHER 5F EBU3.0 (CATHETERS) IMPLANT
CATHETER LAUNCHER 5F EBU3.0 (CATHETERS) ×2
DEVICE RAD COMP TR BAND LRG (VASCULAR PRODUCTS) ×1 IMPLANT
GLIDESHEATH SLEND SS 6F .021 (SHEATH) ×1 IMPLANT
GUIDEWIRE INQWIRE 1.5J.035X260 (WIRE) IMPLANT
HOVERMATT SINGLE USE (MISCELLANEOUS) ×1 IMPLANT
INQWIRE 1.5J .035X260CM (WIRE) ×2
KIT HEART LEFT (KITS) ×2 IMPLANT
PACK CARDIAC CATHETERIZATION (CUSTOM PROCEDURE TRAY) ×2 IMPLANT
SHEATH PROBE COVER 6X72 (BAG) ×1 IMPLANT
SYR MEDRAD MARK 7 150ML (SYRINGE) ×2 IMPLANT
TRANSDUCER W/STOPCOCK (MISCELLANEOUS) ×2 IMPLANT
TUBING CIL FLEX 10 FLL-RA (TUBING) ×2 IMPLANT

## 2018-10-02 NOTE — Discharge Instructions (Signed)
Moderate Conscious Sedation, Adult, Care After These instructions provide you with information about caring for yourself after your procedure. Your health care provider may also give you more specific instructions. Your treatment has been planned according to current medical practices, but problems sometimes occur. Call your health care provider if you have any problems or questions after your procedure. What can I expect after the procedure? After your procedure, it is common:  To feel sleepy for several hours.  To feel clumsy and have poor balance for several hours.  To have poor judgment for several hours.  To vomit if you eat too soon. Follow these instructions at home: For at least 24 hours after the procedure:   Do not: ? Participate in activities where you could fall or become injured. ? Drive. ? Use heavy machinery. ? Drink alcohol. ? Take sleeping pills or medicines that cause drowsiness. ? Make important decisions or sign legal documents. ? Take care of children on your own.  Rest. Eating and drinking  Follow the diet recommended by your health care provider.  If you vomit: ? Drink water, juice, or soup when you can drink without vomiting. ? Make sure you have little or no nausea before eating solid foods. General instructions  Have a responsible adult stay with you until you are awake and alert.  Take over-the-counter and prescription medicines only as told by your health care provider.  If you smoke, do not smoke without supervision.  Keep all follow-up visits as told by your health care provider. This is important. Contact a health care provider if:  You keep feeling nauseous or you keep vomiting.  You feel light-headed.  You develop a rash.  You have a fever. Get help right away if:  You have trouble breathing. This information is not intended to replace advice given to you by your health care provider. Make sure you discuss any questions you have  with your health care provider. Document Released: 06/24/2013 Document Revised: 02/06/2016 Document Reviewed: 12/24/2015 Elsevier Interactive Patient Education  2019 [employer] Inc[employer]  DRINK PLENTY OF FLUIDS AND KEEP RIGHT ARM AT OR ABOVE LEVEL OF HEART  Radial Site Care  This sheet gives you information about how to care for yourself after your procedure. Your health care provider may also give you more specific instructions. If you have problems or questions, contact your health care provider. What can I expect after the procedure? After the procedure, it is common to have:  Bruising and tenderness at the catheter insertion area. Follow these instructions at home: Medicines  Take over-the-counter and prescription medicines only as told by your health care provider. Insertion site care  Follow instructions from your health care provider about how to take care of your insertion site. Make sure you: ? Wash your hands with soap and water before you change your bandage (dressing). If soap and water are not available, use hand sanitizer. ? Change your dressing as told by your health care provider. ? Leave stitches (sutures), skin glue, or adhesive strips in place. These skin closures may need to stay in place for 2 weeks or longer. If adhesive strip edges start to loosen and curl up, you may trim the loose edges. Do not remove adhesive strips completely unless your health care provider tells you to do that.  Check your insertion site every day for signs of infection. Check for: ? Redness, swelling, or pain. ? Fluid or blood. ? Pus or a bad smell. ? Warmth.  Do not  take baths, swim, or use a hot tub until your health care provider approves.  You may shower 24-48 hours after the procedure, or as directed by your health care provider. ? Remove the dressing and gently wash the site with plain soap and water. ? Pat the area dry with a clean towel. ? Do not rub the site. That could cause  bleeding.  Do not apply powder or lotion to the site. Activity   For 24 hours after the procedure, or as directed by your health care provider: ? Do not flex or bend the affected arm. ? Do not push or pull heavy objects with the affected arm. ? Do not drive yourself home from the hospital or clinic. You may drive 24 hours after the procedure unless your health care provider tells you not to. ? Do not operate machinery or power tools.  Do not lift anything that is heavier than 10 lb (4.5 kg), or the limit that you are told, until your health care provider says that it is safe.  Ask your health care provider when it is okay to: ? Return to work or school. ? Resume usual physical activities or sports. ? Resume sexual activity. General instructions  If the catheter site starts to bleed, raise your arm and put firm pressure on the site. If the bleeding does not stop, get help right away. This is a medical emergency.  If you went home on the same day as your procedure, a responsible adult should be with you for the first 24 hours after you arrive home.  Keep all follow-up visits as told by your health care provider. This is important. Contact a health care provider if:  You have a fever.  You have redness, swelling, or yellow drainage around your insertion site. Get help right away if:  You have unusual pain at the radial site.  The catheter insertion area swells very fast.  The insertion area is bleeding, and the bleeding does not stop when you hold steady pressure on the area.  Your arm or hand becomes pale, cool, tingly, or numb. These symptoms may represent a serious problem that is an emergency. Do not wait to see if the symptoms will go away. Get medical help right away. Call your local emergency services (911 in the [place] Do not drive yourself to the hospital. Summary  After the procedure, it is common to have bruising and tenderness at the site.  Follow instructions  from your health care provider about how to take care of your radial site wound. Check the wound every day for signs of infection.  Do not lift anything that is heavier than 10 lb (4.5 kg), or the limit that you are told, until your health care provider says that it is safe. This information is not intended to replace advice given to you by your health care provider. Make sure you discuss any questions you have with your health care provider. Document Released: 10/06/2010 Document Revised: 10/09/2017 Document Reviewed: 10/09/2017 Elsevier Interactive Patient Education  2019 [employer].

## 2018-10-02 NOTE — Interval H&P Note (Signed)
History and Physical Interval Note:  10/02/2018 8:45 AM  Charles Esparza  has presented today for cardiac catheterization, with the diagnosis of dyspnea on exertion and angina. The various methods of treatment have been discussed with the patient and family. After consideration of risks, benefits and other options for treatment, the patient has consented to  Procedure(s): LEFT HEART CATH AND CORONARY ANGIOGRAPHY (N/A) as a surgical intervention .  The patient's history has been reviewed, patient examined, no change in status, stable for surgery.  I have reviewed the patient's chart and labs.  Questions were answered to the patient's satisfaction.    Cath Lab Visit (complete for each Cath Lab visit)  Clinical Evaluation Leading to the Procedure:   ACS: No.  Non-ACS:    Anginal Classification: CCS III  Anti-ischemic medical therapy: Minimal Therapy (1 class of medications)  Non-Invasive Test Results: No non-invasive testing performed  Prior CABG: No previous CABG  Charles Esparza

## 2018-10-02 NOTE — Brief Op Note (Signed)
BRIEF CARDIAC CATHETERIZATION NOTE  DATE: 10/02/2018 TIME: 9:43 AM  PATIENT:  Charles Esparza  48 y.o. male  PRE-OPERATIVE DIAGNOSIS:  Dyspnea on exertion and angina pectoris  POST-OPERATIVE DIAGNOSIS:  Chronic HFpEF  PROCEDURE:  Procedure(s): LEFT HEART CATH AND CORONARY ANGIOGRAPHY (N/A)  SURGEON:  Surgeon(s) and Role:    * Harriet Bollen, Cristal Deer, MD - Primary  FINDINGS: 1. No angiographically significant CAD. 2. Normal LVEF. 3. Moderately elevated LVEDP.  RECOMMENDATIONS: 1. Primary prevention of CAD. 2. Increase furosemide to 40 mg daily; will need BMP in 1-2 weeks to reassess renal function and K. 3. Weight loss.  Yvonne Kendall, MD [facility] Pager: [phone]

## 2018-10-03 ENCOUNTER — Encounter (HOSPITAL_COMMUNITY): Payer: Self-pay | Admitting: Internal Medicine

## 2018-10-03 ENCOUNTER — Telehealth: Payer: Self-pay | Admitting: Emergency Medicine

## 2018-10-03 DIAGNOSIS — I10 Essential (primary) hypertension: Secondary | ICD-10-CM

## 2018-10-03 NOTE — Telephone Encounter (Signed)
Patient informed to have labs drawn in one week per Dr. Tomie China. Patient verbally understands.

## 2018-10-03 NOTE — Progress Notes (Signed)
Patient informed. 

## 2018-10-11 LAB — BASIC METABOLIC PANEL
BUN/Creatinine Ratio: 19 (ref 9–20)
BUN: 16 mg/dL (ref 6–24)
CO2: 26 mmol/L (ref 20–29)
Calcium: 9.2 mg/dL (ref 8.7–10.2)
Chloride: 97 mmol/L (ref 96–106)
Creatinine, Ser: 0.86 mg/dL (ref 0.76–1.27)
GFR calc Af Amer: 118 mL/min/{1.73_m2} (ref >59)
GFR calc non Af Amer: 102 mL/min/{1.73_m2} (ref >59)
Glucose: 162 mg/dL — ABNORMAL HIGH (ref 65–99)
Potassium: 4.3 mmol/L (ref 3.5–5.2)
Sodium: 140 mmol/L (ref 134–144)

## 2018-10-30 ENCOUNTER — Ambulatory Visit: Payer: Medicare Other | Admitting: Cardiology

## 2018-11-04 ENCOUNTER — Ambulatory Visit: Payer: Medicare Other | Admitting: Cardiology

## 2018-11-07 ENCOUNTER — Ambulatory Visit: Payer: Medicare Other | Admitting: Cardiology

## 2018-11-10 ENCOUNTER — Encounter: Payer: Self-pay | Admitting: Cardiology

## 2018-11-10 ENCOUNTER — Ambulatory Visit (INDEPENDENT_AMBULATORY_CARE_PROVIDER_SITE_OTHER): Payer: Medicare Other | Admitting: Cardiology

## 2018-11-10 VITALS — BP 126/72 | HR 81 | Ht 71.0 in | Wt 375.0 lb

## 2018-11-10 DIAGNOSIS — E088 Diabetes mellitus due to underlying condition with unspecified complications: Secondary | ICD-10-CM | POA: Diagnosis not present

## 2018-11-10 DIAGNOSIS — R0609 Other forms of dyspnea: Secondary | ICD-10-CM

## 2018-11-10 DIAGNOSIS — I10 Essential (primary) hypertension: Secondary | ICD-10-CM

## 2018-11-10 LAB — BASIC METABOLIC PANEL
BUN/Creatinine Ratio: 22 — ABNORMAL HIGH (ref 9–20)
BUN: 17 mg/dL (ref 6–24)
CO2: 26 mmol/L (ref 20–29)
Calcium: 9.5 mg/dL (ref 8.7–10.2)
Chloride: 99 mmol/L (ref 96–106)
Creatinine, Ser: 0.78 mg/dL (ref 0.76–1.27)
GFR calc Af Amer: 123 mL/min/{1.73_m2} (ref >59)
GFR calc non Af Amer: 106 mL/min/{1.73_m2} (ref >59)
Glucose: 177 mg/dL — ABNORMAL HIGH (ref 65–99)
Potassium: 4.4 mmol/L (ref 3.5–5.2)
Sodium: 141 mmol/L (ref 134–144)

## 2018-11-10 NOTE — Progress Notes (Signed)
Cardiology Office Note:    Date:  11/10/2018   ID:  Charles Esparza, DOB [birth year], MRN [id number]  PCP:  Abigail Miyamoto, MD  Cardiologist:  Garwin Brothers, MD   Referring MD: Abigail Miyamoto,*    ASSESSMENT:    1. Essential hypertension   2. Diabetes mellitus due to underlying condition with unspecified complications (HCC)   3. Dyspnea on exertion   4. Morbid obesity (HCC)    PLAN:    In order of problems listed above:  1. Primary prevention stressed with the patient.  Importance of compliance with diet and medication stressed and he vocalized understanding.  His blood pressure is stable.  I congratulated him about weight loss.  Diet was discussed for dyslipidemia and diabetes mellitus and obesity weight reduction was stressed.  Risks of obesity explained and he will continue to do better with weight loss. 2. He will have a Chem-7 as he is now on a diuretic.Patient will be seen in follow-up appointment in 6 months or earlier if the patient has any concerns    Medication Adjustments/Labs and Tests Ordered: Current medicines are reviewed at length with the patient today.  Concerns regarding medicines are outlined above.  No orders of the defined types were placed in this encounter.  No orders of the defined types were placed in this encounter.    No chief complaint on file.    History of Present Illness:    Charles Esparza is a 48 y.o. male.  Patient has past medical history of essential hypertension, dyslipidemia and diabetes mellitus.  He denies any problems at this time.  His symptoms were very concerning and I sent him for coronary angiography which revealed preserved left ventricular systolic function and elevated end-diastolic pressures and normal coronary arteries.  Subsequently the patient has been initiated on diuretic therapy and is feeling much better.  He has begun to diet better and is feeling much better.  He wants to now start an exercise  program.  At the time of my evaluation, the patient is alert awake oriented and in no distress.  Past Medical History:  Diagnosis Date  . Asthma   . COPD (chronic obstructive pulmonary disease) (HCC)     Past Surgical History:  Procedure Laterality Date  . LEFT HEART CATH AND CORONARY ANGIOGRAPHY N/A 10/02/2018   Procedure: LEFT HEART CATH AND CORONARY ANGIOGRAPHY;  Surgeon: Yvonne Kendall, MD;  Location: [facility] INVASIVE CV LAB;  Service: Cardiovascular;  Laterality: N/A;    Current Medications: Current Meds  Medication Sig  . acetaminophen (TYLENOL) 500 MG tablet Take 1,000 mg by mouth daily as needed for moderate pain or headache.  Marland Kitchen aspirin EC 81 MG tablet Take 81 mg by mouth daily.  . furosemide (LASIX) 40 MG tablet Take 1 tablet (40 mg total) by mouth daily.  Marland Kitchen lisinopril (PRINIVIL,ZESTRIL) 10 MG tablet Take 10 mg by mouth daily.  Marland Kitchen loratadine (CLARITIN) 10 MG tablet Take 10 mg by mouth daily as needed for allergies.  Marland Kitchen LORazepam (ATIVAN) 0.5 MG tablet Take 0.5 mg by mouth at bedtime as needed for sleep.   . metoprolol tartrate (LOPRESSOR) 50 MG tablet Take 50 mg by mouth 2 (two) times daily.   . naproxen sodium (ALEVE) 220 MG tablet Take 440 mg by mouth daily.  . nitroGLYCERIN (NITROSTAT) 0.4 MG SL tablet Place 1 tablet (0.4 mg total) under the tongue every 5 (five) minutes as needed for chest pain.  Marland Kitchen OVER THE COUNTER MEDICATION Take  1 capsule by mouth daily. Testboost otc testosterone  . oxymetazoline (AFRIN) 0.05 % nasal spray Place 2 sprays into both nostrils 2 (two) times daily as needed for congestion.  . predniSONE (DELTASONE) 20 MG tablet Take 20-60 mg by mouth See admin instructions. Take 3 tablets (60 mg) by mouth daily for 2 days, take 2 tablets (40 mg) by mouth daily for 4 days, & take 1 tablet (20 mg) by mouth for 2 days.  . sildenafil (REVATIO) 20 MG tablet Take 60 mg by mouth daily as needed (ED).     Allergies:   Patient has no known allergies.   Social History    Socioeconomic History  . Marital status: Married    Spouse name: Not on file  . Number of children: Not on file  . Years of education: Not on file  . Highest education level: Not on file  Occupational History  . Not on file  Social Needs  . Financial resource strain: Not on file  . Food insecurity:    Worry: Not on file    Inability: Not on file  . Transportation needs:    Medical: Not on file    Non-medical: Not on file  Tobacco Use  . Smoking status: Never Smoker  . Smokeless tobacco: Never Used  Substance and Sexual Activity  . Alcohol use: Not on file  . Drug use: Not on file  . Sexual activity: Not on file  Lifestyle  . Physical activity:    Days per week: Not on file    Minutes per session: Not on file  . Stress: Not on file  Relationships  . Social connections:    Talks on phone: Not on file    Gets together: Not on file    Attends religious service: Not on file    Active member of club or organization: Not on file    Attends meetings of clubs or organizations: Not on file    Relationship status: Not on file  Other Topics Concern  . Not on file  Social History Narrative  . Not on file     Family History: The patient's family history is not on file.  ROS:   Please see the history of present illness.    All other systems reviewed and are negative.  EKGs/Labs/Other Studies Reviewed:    The following studies were reviewed today: I discussed my findings with the patient at extensive length.  Coronary angiography revealed normal coronary arteries with preserved systolic function.   Recent Labs: 09/29/2018: Hemoglobin 12.7; Platelets 264 10/10/2018: BUN 16; Creatinine, Ser 0.86; Potassium 4.3; Sodium 140  Recent Lipid Panel No results found for: CHOL, TRIG, HDL, CHOLHDL, VLDL, LDLCALC, LDLDIRECT  Physical Exam:    VS:  BP 126/72 (BP Location: Right Arm, Patient Position: Sitting, Cuff Size: Normal)   Pulse 81   Ht 5\' 11"  (1.803 m)   Wt (!) 375 lb  (170.1 kg)   SpO2 98%   BMI 52.30 kg/m     Wt Readings from Last 3 Encounters:  11/10/18 (!) 375 lb (170.1 kg)  10/02/18 (!) 390 lb (176.9 kg)  09/29/18 (!) 390 lb 11.2 oz (177.2 kg)     GEN: Patient is in no acute distress HEENT: Normal NECK: No JVD; No carotid bruits LYMPHATICS: No lymphadenopathy CARDIAC: Hear sounds regular, 2/6 systolic murmur at the apex. RESPIRATORY:  Clear to auscultation without rales, wheezing or rhonchi  ABDOMEN: Soft, non-tender, non-distended MUSCULOSKELETAL:  No edema; No deformity  SKIN:  Warm and dry NEUROLOGIC:  Alert and oriented x 3 PSYCHIATRIC:  Normal affect   Signed, Garwin Brothers, MD  11/10/2018 10:41 AM    [facility]

## 2018-11-10 NOTE — Patient Instructions (Signed)
Medication Instructions: Your physician recommends that you continue on your current medications as directed. Please refer to the Current Medication list given to you today.   If you need a refill on your cardiac medications before your next appointment, please call your pharmacy.   Lab work: Your physician recommends that you return for lab work in: [facility]   If you have labs (blood work) drawn today and your tests are completely normal, you will receive your results only by: Marland Kitchen MyChart Message (if you have MyChart) OR . A paper copy in the mail If you have any lab test that is abnormal or we need to change your treatment, we will call you to review the results.  Testing/Procedures: None  Follow-Up: At [facility], you and your health needs are our priority.  As part of our continuing mission to provide you with exceptional heart care, we have created designated Provider Care Teams.  These Care Teams include your primary Cardiologist (physician) and Advanced Practice Providers (APPs -  Physician Assistants and Nurse Practitioners) who all work together to provide you with the care you need, when you need it. You will need a follow up appointment in 6 months.  Please call our office 2 months in advance to schedule this appointment.  You may see No primary care provider on file. or another member of our [employer] Provider Team in [place]: Gypsy Balsam, MD . Norman Herrlich, MD  Any Other Special Instructions Will Be Listed Below (If Applicable).

## 2018-11-10 NOTE — Addendum Note (Signed)
Addended by: Carren Rang on: 11/10/2018 10:48 AM   Modules accepted: Orders

## 2019-01-09 ENCOUNTER — Other Ambulatory Visit: Payer: Self-pay | Admitting: Internal Medicine

## 2019-01-09 NOTE — Telephone Encounter (Signed)
Rx refill sent to pharmacy. 

## 2019-01-09 NOTE — Telephone Encounter (Signed)
Please review for refill, Thanks !  

## 2019-05-11 ENCOUNTER — Ambulatory Visit: Payer: Medicare Other | Admitting: Cardiology

## 2019-05-18 ENCOUNTER — Ambulatory Visit: Payer: Medicare Other | Admitting: Cardiology

## 2019-06-26 ENCOUNTER — Ambulatory Visit: Payer: Medicare Other | Admitting: Cardiology

## 2019-07-23 ENCOUNTER — Ambulatory Visit: Payer: Medicare Other | Admitting: Cardiology

## 2019-08-24 ENCOUNTER — Ambulatory Visit: Payer: Medicare Other | Admitting: Cardiology

## 2019-09-30 DIAGNOSIS — J9801 Acute bronchospasm: Secondary | ICD-10-CM | POA: Diagnosis not present

## 2019-09-30 DIAGNOSIS — J209 Acute bronchitis, unspecified: Secondary | ICD-10-CM | POA: Diagnosis not present

## 2019-10-13 ENCOUNTER — Ambulatory Visit: Payer: Medicare Other | Admitting: Cardiology

## 2019-10-26 DIAGNOSIS — R739 Hyperglycemia, unspecified: Secondary | ICD-10-CM | POA: Diagnosis not present

## 2019-10-26 DIAGNOSIS — Z202 Contact with and (suspected) exposure to infections with a predominantly sexual mode of transmission: Secondary | ICD-10-CM | POA: Diagnosis not present

## 2019-10-26 DIAGNOSIS — N481 Balanitis: Secondary | ICD-10-CM | POA: Diagnosis not present

## 2019-10-31 ENCOUNTER — Other Ambulatory Visit: Payer: Self-pay | Admitting: Legal Medicine

## 2019-11-30 DIAGNOSIS — M4725 Other spondylosis with radiculopathy, thoracolumbar region: Secondary | ICD-10-CM | POA: Diagnosis not present

## 2019-11-30 DIAGNOSIS — M4726 Other spondylosis with radiculopathy, lumbar region: Secondary | ICD-10-CM | POA: Diagnosis not present

## 2019-11-30 DIAGNOSIS — M9904 Segmental and somatic dysfunction of sacral region: Secondary | ICD-10-CM | POA: Diagnosis not present

## 2019-11-30 DIAGNOSIS — M5388 Other specified dorsopathies, sacral and sacrococcygeal region: Secondary | ICD-10-CM | POA: Diagnosis not present

## 2019-11-30 DIAGNOSIS — M9903 Segmental and somatic dysfunction of lumbar region: Secondary | ICD-10-CM | POA: Diagnosis not present

## 2019-11-30 DIAGNOSIS — M9902 Segmental and somatic dysfunction of thoracic region: Secondary | ICD-10-CM | POA: Diagnosis not present

## 2019-12-02 DIAGNOSIS — M9903 Segmental and somatic dysfunction of lumbar region: Secondary | ICD-10-CM | POA: Diagnosis not present

## 2019-12-02 DIAGNOSIS — M4726 Other spondylosis with radiculopathy, lumbar region: Secondary | ICD-10-CM | POA: Diagnosis not present

## 2019-12-02 DIAGNOSIS — M4725 Other spondylosis with radiculopathy, thoracolumbar region: Secondary | ICD-10-CM | POA: Diagnosis not present

## 2019-12-02 DIAGNOSIS — M5388 Other specified dorsopathies, sacral and sacrococcygeal region: Secondary | ICD-10-CM | POA: Diagnosis not present

## 2019-12-02 DIAGNOSIS — M9902 Segmental and somatic dysfunction of thoracic region: Secondary | ICD-10-CM | POA: Diagnosis not present

## 2019-12-02 DIAGNOSIS — M9904 Segmental and somatic dysfunction of sacral region: Secondary | ICD-10-CM | POA: Diagnosis not present

## 2019-12-04 DIAGNOSIS — M9902 Segmental and somatic dysfunction of thoracic region: Secondary | ICD-10-CM | POA: Diagnosis not present

## 2019-12-04 DIAGNOSIS — M5388 Other specified dorsopathies, sacral and sacrococcygeal region: Secondary | ICD-10-CM | POA: Diagnosis not present

## 2019-12-04 DIAGNOSIS — M9904 Segmental and somatic dysfunction of sacral region: Secondary | ICD-10-CM | POA: Diagnosis not present

## 2019-12-04 DIAGNOSIS — M4726 Other spondylosis with radiculopathy, lumbar region: Secondary | ICD-10-CM | POA: Diagnosis not present

## 2019-12-04 DIAGNOSIS — M9903 Segmental and somatic dysfunction of lumbar region: Secondary | ICD-10-CM | POA: Diagnosis not present

## 2019-12-04 DIAGNOSIS — M4725 Other spondylosis with radiculopathy, thoracolumbar region: Secondary | ICD-10-CM | POA: Diagnosis not present

## 2019-12-18 DIAGNOSIS — M9903 Segmental and somatic dysfunction of lumbar region: Secondary | ICD-10-CM | POA: Diagnosis not present

## 2019-12-18 DIAGNOSIS — M5388 Other specified dorsopathies, sacral and sacrococcygeal region: Secondary | ICD-10-CM | POA: Diagnosis not present

## 2019-12-18 DIAGNOSIS — M4725 Other spondylosis with radiculopathy, thoracolumbar region: Secondary | ICD-10-CM | POA: Diagnosis not present

## 2019-12-18 DIAGNOSIS — M9902 Segmental and somatic dysfunction of thoracic region: Secondary | ICD-10-CM | POA: Diagnosis not present

## 2019-12-18 DIAGNOSIS — M9904 Segmental and somatic dysfunction of sacral region: Secondary | ICD-10-CM | POA: Diagnosis not present

## 2019-12-18 DIAGNOSIS — M4726 Other spondylosis with radiculopathy, lumbar region: Secondary | ICD-10-CM | POA: Diagnosis not present

## 2020-04-22 DIAGNOSIS — Z20822 Contact with and (suspected) exposure to covid-19: Secondary | ICD-10-CM | POA: Diagnosis not present

## 2020-04-22 DIAGNOSIS — B342 Coronavirus infection, unspecified: Secondary | ICD-10-CM | POA: Diagnosis not present

## 2020-04-29 DIAGNOSIS — R030 Elevated blood-pressure reading, without diagnosis of hypertension: Secondary | ICD-10-CM | POA: Diagnosis not present

## 2020-04-29 DIAGNOSIS — U071 COVID-19: Secondary | ICD-10-CM | POA: Diagnosis not present

## 2020-05-10 ENCOUNTER — Other Ambulatory Visit: Payer: Self-pay | Admitting: Legal Medicine

## 2020-06-18 ENCOUNTER — Other Ambulatory Visit: Payer: Self-pay | Admitting: Legal Medicine

## 2020-10-04 DIAGNOSIS — I10 Essential (primary) hypertension: Secondary | ICD-10-CM | POA: Diagnosis not present

## 2020-10-04 DIAGNOSIS — J069 Acute upper respiratory infection, unspecified: Secondary | ICD-10-CM | POA: Diagnosis not present

## 2020-10-04 DIAGNOSIS — Z20822 Contact with and (suspected) exposure to covid-19: Secondary | ICD-10-CM | POA: Diagnosis not present

## 2020-10-04 DIAGNOSIS — J45909 Unspecified asthma, uncomplicated: Secondary | ICD-10-CM | POA: Diagnosis not present

## 2020-10-04 DIAGNOSIS — Z87891 Personal history of nicotine dependence: Secondary | ICD-10-CM | POA: Diagnosis not present

## 2020-10-12 DIAGNOSIS — R0981 Nasal congestion: Secondary | ICD-10-CM | POA: Diagnosis not present

## 2020-10-12 DIAGNOSIS — I10 Essential (primary) hypertension: Secondary | ICD-10-CM | POA: Diagnosis not present

## 2020-10-12 DIAGNOSIS — J45909 Unspecified asthma, uncomplicated: Secondary | ICD-10-CM | POA: Diagnosis not present

## 2020-10-12 DIAGNOSIS — H6691 Otitis media, unspecified, right ear: Secondary | ICD-10-CM | POA: Diagnosis not present

## 2020-11-16 DIAGNOSIS — Z1152 Encounter for screening for COVID-19: Secondary | ICD-10-CM | POA: Diagnosis not present

## 2020-11-16 DIAGNOSIS — J209 Acute bronchitis, unspecified: Secondary | ICD-10-CM | POA: Diagnosis not present

## 2021-01-29 ENCOUNTER — Other Ambulatory Visit: Payer: Self-pay | Admitting: Legal Medicine

## 2021-01-29 DIAGNOSIS — I10 Essential (primary) hypertension: Secondary | ICD-10-CM

## 2021-01-31 ENCOUNTER — Other Ambulatory Visit: Payer: Self-pay

## 2021-01-31 MED ORDER — LISINOPRIL 10 MG PO TABS: 10.0000 mg | ORAL_TABLET | Freq: Every day | ORAL | 0 refills | Status: DC

## 2021-02-27 DIAGNOSIS — Z20822 Contact with and (suspected) exposure to covid-19: Secondary | ICD-10-CM | POA: Diagnosis not present

## 2021-02-27 DIAGNOSIS — J069 Acute upper respiratory infection, unspecified: Secondary | ICD-10-CM | POA: Diagnosis not present

## 2021-03-03 ENCOUNTER — Other Ambulatory Visit: Payer: Self-pay | Admitting: Family Medicine

## 2021-03-03 ENCOUNTER — Other Ambulatory Visit: Payer: Self-pay | Admitting: Legal Medicine

## 2021-03-12 ENCOUNTER — Emergency Department (HOSPITAL_COMMUNITY)
Admission: EM | Admit: 2021-03-12 | Discharge: 2021-03-13 | Disposition: A | Discharge status: Home / Self Care | Payer: Medicare Other | Attending: Emergency Medicine | Admitting: Emergency Medicine

## 2021-03-12 ENCOUNTER — Other Ambulatory Visit: Payer: Self-pay

## 2021-03-12 ENCOUNTER — Encounter (HOSPITAL_COMMUNITY): Payer: Self-pay | Admitting: Emergency Medicine

## 2021-03-12 ENCOUNTER — Emergency Department (HOSPITAL_COMMUNITY): Payer: Medicare Other

## 2021-03-12 DIAGNOSIS — U071 COVID-19: Secondary | ICD-10-CM | POA: Diagnosis not present

## 2021-03-12 DIAGNOSIS — R000 Tachycardia, unspecified: Secondary | ICD-10-CM | POA: Insufficient documentation

## 2021-03-12 DIAGNOSIS — J45909 Unspecified asthma, uncomplicated: Secondary | ICD-10-CM | POA: Insufficient documentation

## 2021-03-12 DIAGNOSIS — Z7982 Long term (current) use of aspirin: Secondary | ICD-10-CM | POA: Diagnosis not present

## 2021-03-12 DIAGNOSIS — E119 Type 2 diabetes mellitus without complications: Secondary | ICD-10-CM | POA: Insufficient documentation

## 2021-03-12 DIAGNOSIS — Z79899 Other long term (current) drug therapy: Secondary | ICD-10-CM | POA: Insufficient documentation

## 2021-03-12 DIAGNOSIS — I10 Essential (primary) hypertension: Secondary | ICD-10-CM | POA: Insufficient documentation

## 2021-03-12 DIAGNOSIS — R0602 Shortness of breath: Secondary | ICD-10-CM | POA: Diagnosis not present

## 2021-03-12 DIAGNOSIS — Z28310 Unvaccinated for covid-19: Secondary | ICD-10-CM | POA: Diagnosis not present

## 2021-03-12 DIAGNOSIS — Z7951 Long term (current) use of inhaled steroids: Secondary | ICD-10-CM | POA: Diagnosis not present

## 2021-03-12 DIAGNOSIS — Z8616 Personal history of COVID-19: Secondary | ICD-10-CM | POA: Insufficient documentation

## 2021-03-12 DIAGNOSIS — J449 Chronic obstructive pulmonary disease, unspecified: Secondary | ICD-10-CM | POA: Diagnosis not present

## 2021-03-12 DIAGNOSIS — R509 Fever, unspecified: Secondary | ICD-10-CM | POA: Diagnosis not present

## 2021-03-12 HISTORY — DX: Essential (primary) hypertension: I10

## 2021-03-12 LAB — CBC WITH DIFFERENTIAL/PLATELET
Abs Immature Granulocytes: 0.07 10*3/uL (ref 0.00–0.07)
Basophils Absolute: 0 10*3/uL (ref 0.0–0.1)
Basophils Relative: 0 %
Eosinophils Absolute: 0 10*3/uL (ref 0.0–0.5)
Eosinophils Relative: 0 %
HCT: 40.6 % (ref 39.0–52.0)
Hemoglobin: 13.6 g/dL (ref 13.0–17.0)
Immature Granulocytes: 1 %
Lymphocytes Relative: 8 %
Lymphs Abs: 1.1 10*3/uL (ref 0.7–4.0)
MCH: 28.5 pg (ref 26.0–34.0)
MCHC: 33.5 g/dL (ref 30.0–36.0)
MCV: 84.9 fL (ref 80.0–100.0)
Monocytes Absolute: 1.1 10*3/uL — ABNORMAL HIGH (ref 0.1–1.0)
Monocytes Relative: 8 %
Neutro Abs: 11.3 10*3/uL — ABNORMAL HIGH (ref 1.7–7.7)
Neutrophils Relative %: 83 %
Platelets: 195 10*3/uL (ref 150–400)
RBC: 4.78 MIL/uL (ref 4.22–5.81)
RDW: 12.3 % (ref 11.5–15.5)
WBC: 13.6 10*3/uL — ABNORMAL HIGH (ref 4.0–10.5)
nRBC: 0 % (ref 0.0–0.2)

## 2021-03-12 LAB — COMPREHENSIVE METABOLIC PANEL
ALT: 24 U/L (ref 0–44)
AST: 17 U/L (ref 15–41)
Albumin: 3.5 g/dL (ref 3.5–5.0)
Alkaline Phosphatase: 73 U/L (ref 38–126)
Anion gap: 9 (ref 5–15)
BUN: 11 mg/dL (ref 6–20)
CO2: 25 mmol/L (ref 22–32)
Calcium: 9.5 mg/dL (ref 8.9–10.3)
Chloride: 98 mmol/L (ref 98–111)
Creatinine, Ser: 0.74 mg/dL (ref 0.61–1.24)
GFR, Estimated: >60 mL/min (ref >60)
Glucose, Bld: 240 mg/dL — ABNORMAL HIGH (ref 70–99)
Potassium: 3.9 mmol/L (ref 3.5–5.1)
Sodium: 132 mmol/L — ABNORMAL LOW (ref 135–145)
Total Bilirubin: 0.9 mg/dL (ref 0.3–1.2)
Total Protein: 6.9 g/dL (ref 6.5–8.1)

## 2021-03-12 LAB — RESP PANEL BY RT-PCR (FLU A&B, COVID) ARPGX2
Influenza A by PCR: NEGATIVE
Influenza B by PCR: NEGATIVE
SARS Coronavirus 2 by RT PCR: POSITIVE — AB

## 2021-03-12 MED ORDER — BEBTELOVIMAB 175 MG/2 ML IV (EUA)
175.0000 mg | Freq: Once | INTRAMUSCULAR | Status: AC
  Administered 2021-03-12: 175 mg via INTRAVENOUS
  Filled 2021-03-12: qty 2

## 2021-03-12 MED ORDER — EPINEPHRINE 0.3 MG/0.3ML IJ SOAJ: 0.3000 mg | Freq: Once | INTRAMUSCULAR | Status: DC | PRN

## 2021-03-12 MED ORDER — ALBUTEROL SULFATE HFA 108 (90 BASE) MCG/ACT IN AERS: 2.0000 | INHALATION_SPRAY | Freq: Once | RESPIRATORY_TRACT | Status: DC | PRN

## 2021-03-12 MED ORDER — DIPHENHYDRAMINE HCL 50 MG/ML IJ SOLN: 50.0000 mg | Freq: Once | INTRAMUSCULAR | Status: DC | PRN

## 2021-03-12 MED ORDER — FAMOTIDINE IN NACL 20-0.9 MG/50ML-% IV SOLN: 20.0000 mg | Freq: Once | INTRAVENOUS | Status: AC | PRN

## 2021-03-12 MED ORDER — LISINOPRIL 10 MG PO TABS: 10.0000 mg | ORAL_TABLET | Freq: Every day | ORAL | 0 refills | Status: DC

## 2021-03-12 MED ORDER — METHYLPREDNISOLONE SODIUM SUCC 125 MG IJ SOLR: 125.0000 mg | Freq: Once | INTRAMUSCULAR | Status: DC | PRN

## 2021-03-12 MED ORDER — FAMOTIDINE IN NACL 20-0.9 MG/50ML-% IV SOLN: 20.0000 mg | Freq: Once | INTRAVENOUS | Status: DC | PRN

## 2021-03-12 MED ORDER — DIPHENHYDRAMINE HCL 50 MG/ML IJ SOLN: 50.0000 mg | Freq: Once | INTRAMUSCULAR | Status: AC | PRN

## 2021-03-12 MED ORDER — ACETAMINOPHEN 500 MG PO TABS
1000.0000 mg | ORAL_TABLET | Freq: Once | ORAL | Status: AC
  Administered 2021-03-12: 1000 mg via ORAL
  Filled 2021-03-12: qty 2

## 2021-03-12 MED ORDER — BEBTELOVIMAB 175 MG/2 ML IV (EUA): 175.0000 mg | Freq: Once | INTRAMUSCULAR | Status: AC

## 2021-03-12 MED ORDER — ALBUTEROL SULFATE HFA 108 (90 BASE) MCG/ACT IN AERS: 2.0000 | INHALATION_SPRAY | Freq: Once | RESPIRATORY_TRACT | Status: AC | PRN

## 2021-03-12 MED ORDER — ALBUTEROL SULFATE HFA 108 (90 BASE) MCG/ACT IN AERS
2.0000 | INHALATION_SPRAY | Freq: Four times a day (QID) | RESPIRATORY_TRACT | Status: DC
  Administered 2021-03-12: 2 via RESPIRATORY_TRACT
  Filled 2021-03-12: qty 6.7

## 2021-03-12 MED ORDER — EPINEPHRINE 0.3 MG/0.3ML IJ SOAJ: 0.3000 mg | Freq: Once | INTRAMUSCULAR | Status: AC | PRN

## 2021-03-12 MED ORDER — SODIUM CHLORIDE 0.9 % IV SOLN: INTRAVENOUS | Status: DC | PRN

## 2021-03-12 MED ORDER — SODIUM CHLORIDE 0.9 % IV SOLN: INTRAVENOUS | Status: AC | PRN

## 2021-03-12 MED ORDER — SODIUM CHLORIDE 0.9 % IV BOLUS
1000.0000 mL | Freq: Once | INTRAVENOUS | Status: AC
  Administered 2021-03-12: 1000 mL via INTRAVENOUS

## 2021-03-12 MED ORDER — METHYLPREDNISOLONE SODIUM SUCC 125 MG IJ SOLR: 125.0000 mg | Freq: Once | INTRAMUSCULAR | Status: AC | PRN

## 2021-03-12 NOTE — Discharge Instructions (Addendum)
As discussed, you have been diagnosed with COVID.  You have received medication today that should speed your recovery, but this disease can take several weeks for resolution.  Monitor your condition carefully and do not hesitate to return here for concerning changes in your condition.

## 2021-03-12 NOTE — ED Notes (Signed)
X-ray at bedside at this time.

## 2021-03-12 NOTE — ED Triage Notes (Signed)
Pt c/o fever, chills, and nasal congestion. States he was tested for covid at PCP, but was negative.

## 2021-03-12 NOTE — ED Provider Notes (Signed)
[facility] EMERGENCY DEPARTMENT Provider Note   CSN: [id number] Arrival date & time: 03/12/21  1641     History Chief Complaint  Patient presents with   Fever    HAEDEN Esparza is a 50 y.o. male.  HPI  Patient with history of hypertension, obesity, asthma presents with dyspnea, cough, fever, nausea, weakness.  Onset was within the past day.  Symptoms have been persistent, no relief with anything.  Patient had an outpatient test that was negative for COVID, but presents due to worsening symptoms. After initial evaluation, consideration, he is joined by his wife.  She notes that patient had a prodromal illness about a week ago, was briefly better. Patient has not received COVID vaccines.  He notes that he was COVID-positive 6 months ago. He does not smoke.     Past Medical History:  Diagnosis Date   Asthma    COPD (chronic obstructive pulmonary disease) (HCC)    Hypertension     Patient Active Problem List   Diagnosis Date Noted   Dyspnea on exertion 10/02/2018   Morbid obesity (HCC) 09/29/2018   Essential hypertension 09/29/2018   Diabetes mellitus due to underlying condition with unspecified complications (HCC) 09/29/2018    Past Surgical History:  Procedure Laterality Date   LEFT HEART CATH AND CORONARY ANGIOGRAPHY N/A 10/02/2018   Procedure: LEFT HEART CATH AND CORONARY ANGIOGRAPHY;  Surgeon: Yvonne Kendall, MD;  Location: [facility] INVASIVE CV LAB;  Service: Cardiovascular;  Laterality: N/A;       No family history on file.  Social History   Tobacco Use   Smoking status: Never   Smokeless tobacco: Never    Home Medications Prior to Admission medications   Medication Sig Start Date End Date Taking? Authorizing Provider  acetaminophen (TYLENOL) 500 MG tablet Take 1,000 mg by mouth daily as needed for moderate pain or headache.    [provider]  albuterol (PROVENTIL) (2.5 MG/3ML) 0.083% nebulizer solution USE 1 VIAL IN NEBULIZER  THREE TIMES DAILY 05/10/20   Abigail Miyamoto, MD  aspirin EC 81 MG tablet Take 81 mg by mouth daily.    [provider]  furosemide (LASIX) 40 MG tablet Take 1 tablet by mouth once daily 01/09/19   Revankar, Aundra Dubin, MD  lisinopril (ZESTRIL) 10 MG tablet Take 1 tablet (10 mg total) by mouth daily. 01/31/21   Cox, Fritzi Mandes, MD  loratadine (CLARITIN) 10 MG tablet Take 10 mg by mouth daily as needed for allergies.    [provider]  LORazepam (ATIVAN) 0.5 MG tablet Take 0.5 mg by mouth at bedtime as needed for sleep.     [provider]  metoprolol tartrate (LOPRESSOR) 50 MG tablet Take 1 tablet by mouth twice daily 01/30/21   Abigail Miyamoto, MD  naproxen sodium (ALEVE) 220 MG tablet Take 440 mg by mouth daily.    [provider]  nitroGLYCERIN (NITROSTAT) 0.4 MG SL tablet Place 1 tablet (0.4 mg total) under the tongue every 5 (five) minutes as needed for chest pain. 09/29/18 12/28/18  Revankar, Aundra Dubin, MD  OVER THE COUNTER MEDICATION Take 1 capsule by mouth daily. Testboost otc testosterone    [provider]  oxymetazoline (AFRIN) 0.05 % nasal spray Place 2 sprays into both nostrils 2 (two) times daily as needed for congestion.    [provider]  predniSONE (DELTASONE) 20 MG tablet Take 20-60 mg by mouth See admin instructions. Take 3 tablets (60 mg) by mouth daily for 2  days, take 2 tablets (40 mg) by mouth daily for 4 days, & take 1 tablet (20 mg) by mouth for 2 days.    [provider]  sildenafil (REVATIO) 20 MG tablet TAKE 2-5 TABLETS BY MOUTH AS NEEDED PRIOR TO SEXUAL ACTIVITY 06/19/20   Abigail Miyamoto, MD    Allergies    Patient has no known allergies.  Review of Systems   Review of Systems  Constitutional:        Per HPI, otherwise negative  HENT:         Per HPI, otherwise negative  Respiratory:         Per HPI, otherwise negative  Cardiovascular:        Per HPI, otherwise negative  Gastrointestinal:   Positive for nausea.  Endocrine:       Negative aside from HPI  Genitourinary:        Neg aside from HPI   Musculoskeletal:        Per HPI, otherwise negative  Skin: Negative.   Neurological:  Positive for weakness. Negative for syncope.   Physical Exam Updated Vital Signs BP (!) 156/86   Pulse 98   Temp 99.8 F (37.7 C) (Oral)   Resp 18   SpO2 95%   Physical Exam Vitals and nursing note reviewed.  Constitutional:      Appearance: He is well-developed. He is obese. He is diaphoretic.  HENT:     Head: Normocephalic and atraumatic.  Eyes:     Conjunctiva/sclera: Conjunctivae normal.  Cardiovascular:     Rate and Rhythm: Regular rhythm. Tachycardia present.  Pulmonary:     Breath sounds: Decreased air movement present.  Abdominal:     General: There is no distension.  Skin:    General: Skin is warm.  Neurological:     Mental Status: He is alert and oriented to person, place, and time.    ED Results / Procedures / Treatments   Labs (all labs ordered are listed, but only abnormal results are displayed) Labs Reviewed  RESP PANEL BY RT-PCR (FLU A&B, COVID) ARPGX2 - Abnormal; Notable for the following components:      Result Value   SARS Coronavirus 2 by RT PCR POSITIVE (*)    All other components within normal limits  COMPREHENSIVE METABOLIC PANEL - Abnormal; Notable for the following components:   Sodium 132 (*)    Glucose, Bld 240 (*)    All other components within normal limits  CBC WITH DIFFERENTIAL/PLATELET - Abnormal; Notable for the following components:   WBC 13.6 (*)    Neutro Abs 11.3 (*)    Monocytes Absolute 1.1 (*)    All other components within normal limits    EKG None  Radiology DG Chest Port 1 View  Result Date: 03/12/2021 CLINICAL DATA:  COVID-19 positive.  Shortness of breath.  Fever. EXAM: PORTABLE CHEST 1 VIEW COMPARISON:  September 29, 2018 FINDINGS: The heart size and mediastinal contours are within normal limits. Both lungs are clear. The  visualized skeletal structures are unremarkable. IMPRESSION: No active disease. Electronically Signed   By: Gerome Sam III M.D   On: 03/12/2021 19:26    Procedures Procedures   Medications Ordered in ED Medications  albuterol (VENTOLIN HFA) 108 (90 Base) MCG/ACT inhaler 2 puff (2 puffs Inhalation Given 03/12/21 2202)  bebtelovimab EUA injection SOLN 175 mg (has no administration in time range)  0.9 %  sodium chloride infusion (has no administration in time range)  diphenhydrAMINE (BENADRYL) injection  50 mg (has no administration in time range)  famotidine (PEPCID) IVPB 20 mg premix (has no administration in time range)  methylPREDNISolone sodium succinate (SOLU-MEDROL) 125 mg/2 mL injection 125 mg (has no administration in time range)  albuterol (VENTOLIN HFA) 108 (90 Base) MCG/ACT inhaler 2 puff (has no administration in time range)  EPINEPHrine (EPI-PEN) injection 0.3 mg (has no administration in time range)  0.9 %  sodium chloride infusion (has no administration in time range)  bebtelovimab EUA injection SOLN 175 mg (has no administration in time range)  diphenhydrAMINE (BENADRYL) injection 50 mg (has no administration in time range)  famotidine (PEPCID) IVPB 20 mg premix (has no administration in time range)  methylPREDNISolone sodium succinate (SOLU-MEDROL) 125 mg/2 mL injection 125 mg (has no administration in time range)  albuterol (VENTOLIN HFA) 108 (90 Base) MCG/ACT inhaler 2 puff (has no administration in time range)  EPINEPHrine (EPI-PEN) injection 0.3 mg (has no administration in time range)  sodium chloride 0.9 % bolus 1,000 mL (1,000 mLs Intravenous New Bag/Given 03/12/21 2201)    ED Course  I have reviewed the triage vital signs and the nursing notes.  Pertinent labs & imaging results that were available during my care of the patient were reviewed by me and considered in my medical decision making (see chart for details)  Patient is Covid +   10:35 PM  On repeat  exam patient remains tachycardic, work of breathing has seemingly improved.  Labs reviewed, discussed, x-ray without evidence for pneumonia, labs consistent with infection, with mild leukocytosis.  No evidence for acute organ injury.  Given the patient's morbid obesity, asthma, he qualifies for emergency use criteria for Bebtelovimab.   11:19 PM Patient notes that he is feeling better.  Adult male with obesity, asthma, hypertension presents with signs and symptoms consistent with COVID.  COVID is demonstrated on lab evaluation.  No evidence for pneumonia, but given his risk profile he is receiving Bebtelovimab.  If he tolerates this well he will be appropriate for discharge with outpatient follow-up. MDM Rules/Calculators/A&P MDM Number of Diagnoses or Management Options COVID: new, needed workup   Amount and/or Complexity of Data Reviewed Clinical lab tests: ordered and reviewed Tests in the radiology section of CPT: ordered and reviewed Tests in the medicine section of CPT: reviewed and ordered Decide to obtain previous medical records or to obtain history from someone other than the patient: yes Obtain history from someone other than the patient: yes Review and summarize past medical records: yes Independent visualization of images, tracings, or specimens: yes  Risk of Complications, Morbidity, and/or Mortality Presenting problems: high Diagnostic procedures: high Management options: high  Critical Care Total time providing critical care: < 30 minutes  Patient Progress Patient progress: improved   Final Clinical Impression(s) / ED Diagnoses Final diagnoses:  COVID    Rx / DC Orders ED Discharge Orders          Ordered    BEBTELOVIMAB 175 MG/2 ML IV (EUA)   Once        03/12/21 1848    SODIUM CHLORIDE 0.9 % IV SOLN  As needed        03/12/21 1848    DIPHENHYDRAMINE HCL 50 MG/ML IJ SOLN  Once PRN        03/12/21 1848    FAMOTIDINE IN NACL 20-0.9 MG/50ML-% IV SOLN   Once PRN        03/12/21 1848    METHYLPREDNISOLONE SODIUM SUCC 125 MG IJ SOLR  Once  PRN        03/12/21 1848    ALBUTEROL SULFATE HFA 108 (90 BASE) MCG/ACT IN AERS  Once PRN        03/12/21 1848    EPINEPHRINE 0.3 MG/0.3ML IJ SOAJ  Once PRN        03/12/21 1848    Hypersensitivity GRADE 1: Transient flushing or rash, or drug fever < 100.4 F       Comments: Routine, As needed For Until specified Hold infusion for at least 30 minutes Restart infusion at 50% infusion rate if asymptomatic   03/12/21 1848    Hypersensitivity GRADE 2: Rash, flushing, urticaria, dyspnea, or drug fever = or > 100.4 F       Comments: Routine, As needed For Until specified Hold infusion for 30 minutes Give medications as ordered   03/12/21 1848    Hypersensitivity GRADE 3: symptomatic bronchospasm, with or without urticaria, parenteral medication management indicated, allergy-related edema/angioedema, or hypotension       Comments: Routine, As needed For Until specified Hold infusion for at least 30 minutes Give medications as ordered   03/12/21 1848    Hypersensitivity GRADE 4: Anaphylaxis       Comments: Routine, As needed For Until specified Hold infusion Give medications as ordered Call MD   03/12/21 Tawnya Crook, MD 03/12/21 2320

## 2021-03-13 NOTE — ED Notes (Signed)
All appropriate discharge materials reviewed at length with patient. Time for questions provided. Pt has no other questions at this time and verbalizes understanding of all provided materials.  

## 2021-05-18 ENCOUNTER — Other Ambulatory Visit: Payer: Self-pay

## 2021-07-04 DIAGNOSIS — H5203 Hypermetropia, bilateral: Secondary | ICD-10-CM | POA: Diagnosis not present

## 2021-07-04 DIAGNOSIS — H524 Presbyopia: Secondary | ICD-10-CM | POA: Diagnosis not present

## 2021-07-04 DIAGNOSIS — H52223 Regular astigmatism, bilateral: Secondary | ICD-10-CM | POA: Diagnosis not present

## 2021-07-04 DIAGNOSIS — E119 Type 2 diabetes mellitus without complications: Secondary | ICD-10-CM | POA: Diagnosis not present

## 2021-07-21 DIAGNOSIS — I10 Essential (primary) hypertension: Secondary | ICD-10-CM | POA: Diagnosis not present

## 2021-08-03 DIAGNOSIS — I471 Supraventricular tachycardia: Secondary | ICD-10-CM | POA: Diagnosis not present

## 2021-08-03 DIAGNOSIS — J4530 Mild persistent asthma, uncomplicated: Secondary | ICD-10-CM | POA: Diagnosis not present

## 2021-08-03 DIAGNOSIS — J309 Allergic rhinitis, unspecified: Secondary | ICD-10-CM | POA: Diagnosis not present

## 2021-08-03 DIAGNOSIS — I10 Essential (primary) hypertension: Secondary | ICD-10-CM | POA: Diagnosis not present

## 2021-08-03 DIAGNOSIS — Z1331 Encounter for screening for depression: Secondary | ICD-10-CM | POA: Diagnosis not present

## 2021-08-03 DIAGNOSIS — D170 Benign lipomatous neoplasm of skin and subcutaneous tissue of head, face and neck: Secondary | ICD-10-CM | POA: Diagnosis not present

## 2021-08-03 DIAGNOSIS — J069 Acute upper respiratory infection, unspecified: Secondary | ICD-10-CM | POA: Diagnosis not present

## 2021-08-03 DIAGNOSIS — R739 Hyperglycemia, unspecified: Secondary | ICD-10-CM | POA: Diagnosis not present

## 2021-08-03 DIAGNOSIS — Z6841 Body Mass Index (BMI) 40.0 and over, adult: Secondary | ICD-10-CM | POA: Diagnosis not present

## 2021-11-02 DIAGNOSIS — Z6841 Body Mass Index (BMI) 40.0 and over, adult: Secondary | ICD-10-CM | POA: Diagnosis not present

## 2021-11-02 DIAGNOSIS — J019 Acute sinusitis, unspecified: Secondary | ICD-10-CM | POA: Diagnosis not present

## 2021-11-02 DIAGNOSIS — J029 Acute pharyngitis, unspecified: Secondary | ICD-10-CM | POA: Diagnosis not present

## 2021-12-05 DIAGNOSIS — Z6841 Body Mass Index (BMI) 40.0 and over, adult: Secondary | ICD-10-CM | POA: Diagnosis not present

## 2021-12-05 DIAGNOSIS — N3000 Acute cystitis without hematuria: Secondary | ICD-10-CM | POA: Diagnosis not present

## 2021-12-05 DIAGNOSIS — R1011 Right upper quadrant pain: Secondary | ICD-10-CM | POA: Diagnosis not present

## 2021-12-05 DIAGNOSIS — I10 Essential (primary) hypertension: Secondary | ICD-10-CM | POA: Diagnosis not present

## 2021-12-05 DIAGNOSIS — E119 Type 2 diabetes mellitus without complications: Secondary | ICD-10-CM | POA: Diagnosis not present

## 2021-12-05 DIAGNOSIS — E785 Hyperlipidemia, unspecified: Secondary | ICD-10-CM | POA: Diagnosis not present

## 2022-01-31 DIAGNOSIS — E119 Type 2 diabetes mellitus without complications: Secondary | ICD-10-CM | POA: Diagnosis not present

## 2022-01-31 DIAGNOSIS — J309 Allergic rhinitis, unspecified: Secondary | ICD-10-CM | POA: Diagnosis not present

## 2022-01-31 DIAGNOSIS — E785 Hyperlipidemia, unspecified: Secondary | ICD-10-CM | POA: Diagnosis not present

## 2022-01-31 DIAGNOSIS — J4530 Mild persistent asthma, uncomplicated: Secondary | ICD-10-CM | POA: Diagnosis not present

## 2022-01-31 DIAGNOSIS — Z6841 Body Mass Index (BMI) 40.0 and over, adult: Secondary | ICD-10-CM | POA: Diagnosis not present

## 2022-01-31 DIAGNOSIS — I471 Supraventricular tachycardia: Secondary | ICD-10-CM | POA: Diagnosis not present

## 2022-01-31 DIAGNOSIS — Z1211 Encounter for screening for malignant neoplasm of colon: Secondary | ICD-10-CM | POA: Diagnosis not present

## 2022-01-31 DIAGNOSIS — M25562 Pain in left knee: Secondary | ICD-10-CM | POA: Diagnosis not present

## 2022-02-08 ENCOUNTER — Encounter: Payer: Self-pay | Admitting: Physician Assistant

## 2022-02-08 ENCOUNTER — Ambulatory Visit (INDEPENDENT_AMBULATORY_CARE_PROVIDER_SITE_OTHER): Payer: Medicare Other | Admitting: Physician Assistant

## 2022-02-08 ENCOUNTER — Ambulatory Visit (INDEPENDENT_AMBULATORY_CARE_PROVIDER_SITE_OTHER): Payer: Medicare Other

## 2022-02-08 DIAGNOSIS — M25562 Pain in left knee: Secondary | ICD-10-CM

## 2022-02-08 HISTORY — DX: Pain in left knee: M25.562

## 2022-02-08 MED ORDER — MELOXICAM 15 MG PO TABS: 15.0000 mg | ORAL_TABLET | Freq: Every day | ORAL | 1 refills | Status: DC

## 2022-02-08 NOTE — Progress Notes (Signed)
Office Visit Note   Patient: Charles Esparza           Date of Birth: [birth year]           MRN: [id number] Visit Date: 02/08/2022              Requested by: No referring provider defined for this encounter. PCP: Pcp, No  Chief Complaint  Patient presents with   Left Knee - Pain      HPI: Patient is a pleasant 51 year old gentleman who works as a [employer].  He comes in today with a 3-week history of left knee pain.  This occurred when he was bending over to pick a towel up off the floor of his bathroom.  He slipped a little and twisted his left knee.  He complains of pain around the kneecap and along the posterior medial joint line.  He has had a history of some trouble with this knee in the past and it relates it to playing football  Assessment & Plan: Visit Diagnoses:  1. Left knee pain, unspecified chronicity     Plan: [age] year old gentleman with 3-week history of left knee pain.  Cannot really appreciate a big effusion today and his knee is cool.  He does have some medial joint space narrowing as well as some pain with tenderness over the medial joint line.  Consistent with injury could be either a degenerative meniscus tear or inflammation secondary to arthritis.  We will go forward with an injection of some steroid today.  Also discussed with him Voltaren gel topically and a course of meloxicam.  He will follow-up in 3 weeks but if he is better he may cancel this appointment.  If he is worse he will be 6 weeks out from the injury could consider an MRI.  He does have some mechanical symptoms of locking and catching especially when he is sleeping at night  Follow-Up Instructions: No follow-ups on file.   Ortho Exam  Patient is alert, oriented, no adenopathy, well-dressed, normal affect, normal respiratory effort. Left knee: If any just a minimal effusion.  No warmth no redness no cellulitis.  He does have some pain around the kneecap but more typically in the posterior  medial joint line.  Good varus valgus stability minimal pain laterally  Imaging: XR KNEE 3 VIEW LEFT  Result Date: 02/08/2022 Three-view radiographs of his left knee demonstrate slight varus alignment with some joint space narrowing medially and some subchondral sclerotic changes.  Also sclerotic and joint space narrowing over the patella femoral joint.  There is a small anterior ossicle off the anterior midportion of the patella no acute fractures  No images are attached to the encounter.  Labs: No results found for: HGBA1C, ESRSEDRATE, CRP, LABURIC, REPTSTATUS, GRAMSTAIN, CULT, LABORGA   Lab Results  Component Value Date   ALBUMIN 3.5 03/12/2021    No results found for: MG No results found for: VD25OH  No results found for: PREALBUMIN    Latest Ref Rng & Units 03/12/2021    6:46 PM 09/29/2018    4:24 PM  CBC EXTENDED  WBC 4.0 - 10.5 K/uL 13.6   8.3    RBC 4.22 - 5.81 MIL/uL 4.78   4.64    Hemoglobin 13.0 - 17.0 g/dL 62.9   52.8    HCT 41.3 - 52.0 % 40.6   37.7    Platelets 150 - 400 K/uL 195   264    NEUT# 1.7 - 7.7 K/uL  11.3     Lymph# 0.7 - 4.0 K/uL 1.1        There is no height or weight on file to calculate BMI.  Orders:  Orders Placed This Encounter  Procedures   XR KNEE 3 VIEW LEFT   Meds ordered this encounter  Medications   meloxicam (MOBIC) 15 MG tablet    Sig: Take 1 tablet (15 mg total) by mouth daily.    Dispense:  30 tablet    Refill:  1     Procedures: No procedures performed  Clinical Data: No additional findings.  ROS:  All other systems negative, except as noted in the HPI. Review of Systems  Objective: Vital Signs: There were no vitals taken for this visit.  Specialty Comments:  No specialty comments available.  PMFS History: Patient Active Problem List   Diagnosis Date Noted   Pain in left knee 02/08/2022   Dyspnea on exertion 10/02/2018   Morbid obesity (HCC) 09/29/2018   Essential hypertension 09/29/2018   Diabetes  mellitus due to underlying condition with unspecified complications (HCC) 09/29/2018   Past Medical History:  Diagnosis Date   Asthma    COPD (chronic obstructive pulmonary disease) (HCC)    Hypertension     No family history on file.  Past Surgical History:  Procedure Laterality Date   LEFT HEART CATH AND CORONARY ANGIOGRAPHY N/A 10/02/2018   Procedure: LEFT HEART CATH AND CORONARY ANGIOGRAPHY;  Surgeon: Yvonne Kendall, MD;  Location: [facility] INVASIVE CV LAB;  Service: Cardiovascular;  Laterality: N/A;   Social History   Occupational History   Not on file  Tobacco Use   Smoking status: Never   Smokeless tobacco: Never  Substance and Sexual Activity   Alcohol use: Not on file   Drug use: Not on file   Sexual activity: Not on file

## 2022-03-01 ENCOUNTER — Ambulatory Visit: Payer: Medicare Other | Admitting: Orthopaedic Surgery

## 2022-03-13 ENCOUNTER — Ambulatory Visit: Payer: Medicare Other | Admitting: Orthopaedic Surgery

## 2022-04-09 ENCOUNTER — Other Ambulatory Visit: Payer: Self-pay | Admitting: Physician Assistant

## 2022-04-10 ENCOUNTER — Ambulatory Visit (INDEPENDENT_AMBULATORY_CARE_PROVIDER_SITE_OTHER): Payer: Medicare Other | Admitting: Physician Assistant

## 2022-04-10 DIAGNOSIS — M25562 Pain in left knee: Secondary | ICD-10-CM

## 2022-04-10 NOTE — Progress Notes (Signed)
Office Visit Note   Patient: Charles Esparza           Date of Birth: 08-Aug-1971           MRN: [id number] Visit Date: 04/10/2022              Requested by: No referring provider defined for this encounter. PCP: Pcp, No  Chief Complaint  Patient presents with   Left Knee - Pain      HPI: Patient is a pleasant 51 year old gentleman who I have seen in the past most recently 2 months ago with a chief complaint of left knee pain.  This occurred after he twisted his knee and shower and went to pick up a towel on the floor.  We did try an injection which helped for short period of time.  He returns today because he continues to have locking and catching in his knee hurts when he rolls on the inside of it at night.  He also has pain with full extension.  He said he never had this before the injury 9 weeks ago.  Again he has tried a cortisone injection which helped temporarily.  He is also tried over-the-counter medication.  Is now been greater than 6 weeks and he has mechanical symptoms  Assessment & Plan: Visit Diagnoses:  1. Left knee pain, unspecified chronicity     Plan: Given his lack of improvement mechanical symptoms we will go forward with an MRI.  He does understand he has some arthritis in his knee but he says he has had stiffness in the knee for a while but definitely changed when he twisted it we will follow-up once this is completed  Follow-Up Instructions: After MRI  Ortho Exam  Patient is alert, oriented, no adenopathy, well-dressed, normal affect, normal respiratory effort. Pleasant 51 year old gentleman.  No effusion no redness no cellulitis.  He does have pain with terminal extension and flexion.  Pain is focused over the posterior medial joint line good varus valgus stability  Imaging: No results found. No images are attached to the encounter.  Labs: No results found for: "HGBA1C", "ESRSEDRATE", "CRP", "LABURIC", "REPTSTATUS", "GRAMSTAIN", "CULT",  "LABORGA"   Lab Results  Component Value Date   ALBUMIN 3.5 03/12/2021    No results found for: "MG" No results found for: "VD25OH"  No results found for: "PREALBUMIN"    Latest Ref Rng & Units 03/12/2021    6:46 PM 09/29/2018    4:24 PM  CBC EXTENDED  WBC 4.0 - 10.5 K/uL 13.6  8.3   RBC 4.22 - 5.81 MIL/uL 4.78  4.64   Hemoglobin 13.0 - 17.0 g/dL 12.8  78.6   HCT 76.7 - 52.0 % 40.6  37.7   Platelets 150 - 400 K/uL 195  264   NEUT# 1.7 - 7.7 K/uL 11.3    Lymph# 0.7 - 4.0 K/uL 1.1       There is no height or weight on file to calculate BMI.  Orders:  Orders Placed This Encounter  Procedures   MR Knee Left w/o contrast   No orders of the defined types were placed in this encounter.    Procedures: No procedures performed  Clinical Data: No additional findings.  ROS:  All other systems negative, except as noted in the HPI. Review of Systems  Objective: Vital Signs: There were no vitals taken for this visit.  Specialty Comments:  No specialty comments available.  PMFS History: Patient Active Problem List   Diagnosis Date Noted  Pain in left knee 02/08/2022   Dyspnea on exertion 10/02/2018   Morbid obesity (HCC) 09/29/2018   Essential hypertension 09/29/2018   Diabetes mellitus due to underlying condition with unspecified complications (HCC) 09/29/2018   Past Medical History:  Diagnosis Date   Asthma    COPD (chronic obstructive pulmonary disease) (HCC)    Hypertension     No family history on file.  Past Surgical History:  Procedure Laterality Date   LEFT HEART CATH AND CORONARY ANGIOGRAPHY N/A 10/02/2018   Procedure: LEFT HEART CATH AND CORONARY ANGIOGRAPHY;  Surgeon: Yvonne Kendall, MD;  Location: [facility];  Service: Cardiovascular;  Laterality: N/A;   Social History   Occupational History   Not on file  Tobacco Use   Smoking status: Never   Smokeless tobacco: Never  Substance and Sexual Activity   Alcohol use: Not on file    Drug use: Not on file   Sexual activity: Not on file

## 2022-04-24 ENCOUNTER — Other Ambulatory Visit: Payer: Medicare Other

## 2022-05-01 ENCOUNTER — Other Ambulatory Visit: Payer: Medicare Other

## 2022-05-04 DIAGNOSIS — H6693 Otitis media, unspecified, bilateral: Secondary | ICD-10-CM | POA: Diagnosis not present

## 2022-05-04 DIAGNOSIS — R051 Acute cough: Secondary | ICD-10-CM | POA: Diagnosis not present

## 2022-05-04 DIAGNOSIS — J45909 Unspecified asthma, uncomplicated: Secondary | ICD-10-CM | POA: Diagnosis not present

## 2022-05-09 ENCOUNTER — Ambulatory Visit: Payer: Medicare Other | Admitting: Orthopaedic Surgery

## 2022-05-29 ENCOUNTER — Telehealth: Payer: Self-pay | Admitting: Cardiology

## 2022-05-29 ENCOUNTER — Other Ambulatory Visit: Payer: Self-pay

## 2022-05-29 DIAGNOSIS — J45909 Unspecified asthma, uncomplicated: Secondary | ICD-10-CM | POA: Insufficient documentation

## 2022-05-29 DIAGNOSIS — J449 Chronic obstructive pulmonary disease, unspecified: Secondary | ICD-10-CM | POA: Insufficient documentation

## 2022-05-29 NOTE — Telephone Encounter (Signed)
Pt c/o Shortness Of Breath: STAT if SOB developed within the last 24 hours or pt is noticeably SOB on the phone  1. Are you currently SOB (can you hear that pt is SOB on the phone)? No  2. How long have you been experiencing SOB? 2 weeks  3. Are you SOB when sitting or when up moving around? Up moving around  4. Are you currently experiencing any other symptoms? Pt states that he is also having palpitations and states that when he has SOB it is followed up with extreme tiredness. Requesting to speak to someone.

## 2022-05-29 NOTE — Telephone Encounter (Signed)
Pt states he has had fatigue, shortness of breath and palpations. Pt has not been seen since 2020. Appointment made for 05/31/22 at 8:00.

## 2022-05-31 ENCOUNTER — Ambulatory Visit: Payer: Medicare Other | Admitting: Cardiology

## 2022-06-13 ENCOUNTER — Ambulatory Visit: Payer: Medicare Other | Admitting: Cardiology

## 2022-06-15 ENCOUNTER — Other Ambulatory Visit: Payer: Self-pay | Admitting: Physician Assistant

## 2022-06-25 ENCOUNTER — Ambulatory Visit: Payer: Medicare Other | Attending: Cardiology | Admitting: Cardiology

## 2022-06-25 ENCOUNTER — Ambulatory Visit (INDEPENDENT_AMBULATORY_CARE_PROVIDER_SITE_OTHER): Payer: Medicare Other

## 2022-06-25 ENCOUNTER — Encounter: Payer: Self-pay | Admitting: Cardiology

## 2022-06-25 VITALS — BP 144/82 | HR 76 | Ht 71.0 in | Wt 369.0 lb

## 2022-06-25 DIAGNOSIS — J431 Panlobular emphysema: Secondary | ICD-10-CM | POA: Insufficient documentation

## 2022-06-25 DIAGNOSIS — R072 Precordial pain: Secondary | ICD-10-CM | POA: Insufficient documentation

## 2022-06-25 DIAGNOSIS — R0609 Other forms of dyspnea: Secondary | ICD-10-CM | POA: Diagnosis not present

## 2022-06-25 DIAGNOSIS — R011 Cardiac murmur, unspecified: Secondary | ICD-10-CM | POA: Insufficient documentation

## 2022-06-25 DIAGNOSIS — I10 Essential (primary) hypertension: Secondary | ICD-10-CM | POA: Diagnosis not present

## 2022-06-25 DIAGNOSIS — E088 Diabetes mellitus due to underlying condition with unspecified complications: Secondary | ICD-10-CM | POA: Diagnosis not present

## 2022-06-25 DIAGNOSIS — R002 Palpitations: Secondary | ICD-10-CM | POA: Diagnosis not present

## 2022-06-25 DIAGNOSIS — R079 Chest pain, unspecified: Secondary | ICD-10-CM | POA: Insufficient documentation

## 2022-06-25 HISTORY — DX: Cardiac murmur, unspecified: R01.1

## 2022-06-25 HISTORY — DX: Palpitations: R00.2

## 2022-06-25 NOTE — Patient Instructions (Addendum)
FDA-cleared personal EKG: The world's most clinically validated personal EKG, FDA-cleared to detect Atrial Fibrillation, Bradycardia, and Tachycardia. Mayford Knife is the most reliable way to check in on your heart from home. Take your EKG from anywhere: Capture a medical-grade EKG in 30 seconds and get an instant analysis right on your smartphone. Mayford Knife is small enough to fit in your pocket, so you can take it with you anywhere. Easy to use: Simply place your fingers on the sensors--no wires, patches, or gels. Recommended by doctors: A trusted resource, Mayford Knife is the #1 doctor-recommended personal EKG with more than 100 million EKGs recorded. Save or share your EKGs: With the press of a button, email your EKGs to your doctor or save them on your phone. Works with smartphones: Compatible with Event organiser and tablets. Check our compatibility chart. FSA/HSA eligible: Purchase using an FSA or HSA account (please confirm coverage with your insurance provider). Phone clip included with purchase, a $15 value. Conveniently take your device with you wherever you go.  [email or web address]   Step One- Record your EKG strip on [facility] app.   Step two- On Kardia EKG click "Download"   Step three- It will prompt you to make a password for this EKG. Please make the password "Revankar" so that we can view it.   Step four- Click on the little "upload" button (small box with an arrow in the middle) in the bottom left-hand corner of the screen.   Step five- Click "Save to Files"  Step six- Click on "On my iphone" and then "Pages" then press save in the top right-hand corner.   NOW GO TO MYCHART   Once on MyChart click "Messages"  Step one- Click "Send a message"  Step two- Click "Ask a medical question"   Step three- Click "Non urgent medical question"   Step four- Click on Rajan Revankar's name.  Step five- Click on the small  paperclip at the bottom of the screen  Step six- Click "Choose file"  Step seven- Pick the most recent EKG strip listed.   Once uploaded send the message!  Medication Instructions:  Your physician has recommended you make the following change in your medication:   Use nitroglycerin 1 tablet placed under the tongue at the first sign of chest pain or an angina attack. 1 tablet may be used every 5 minutes as needed, for up to 15 minutes. Do not take more than 3 tablets in 15 minutes. If pain persist call 911 or go to the nearest ED.   *If you need a refill on your cardiac medications before your next appointment, please call your pharmacy*   Lab Work: Your physician recommends that you have a BMET today in the office.   If you have labs (blood work) drawn today and your tests are completely normal, you will receive your results only by: MyChart Message (if you have MyChart) OR A paper copy in the mail If you have any lab test that is abnormal or we need to change your treatment, we will call you to review the results.   Testing/Procedures:   Your cardiac CT will be scheduled at one of the below locations:   [facility] [address] [place], [place] [address] [phone]   At [facility], please arrive at the [facility] and Children's Entrance (Entrance C2) of [facility] 30 minutes prior to test start time. You can use the FREE valet parking offered at entrance C (encouraged  to control the heart rate for the test)  Proceed to the [facility] Department (first floor) to check-in and test prep.  All radiology patients and guests should use entrance C2 at [facility], accessed from [facility] [address], even though the hospital's physical address listed is [address].      Please follow these instructions carefully (unless otherwise directed):  Hold all erectile dysfunction medications at least 3 days (72 hrs)  prior to test.  On the Night Before the Test: Be sure to Drink plenty of water. Do not consume any caffeinated/decaffeinated beverages or chocolate 12 hours prior to your test. Do not take any antihistamines 12 hours prior to your test.   On the Day of the Test: Drink plenty of water until 1 hour prior to the test. Do not eat any food 4 hours prior to the test. You may take your regular medications prior to the test.  Take metoprolol (Lopressor) two hours prior to test. HOLD Furosemide/Hydrochlorothiazide morning of the test. After the Test: Drink plenty of water. After receiving IV contrast, you may experience a mild flushed feeling. This is normal. On occasion, you may experience a mild rash up to 24 hours after the test. This is not dangerous. If this occurs, you can take Benadryl 25 mg and increase your fluid intake. If you experience trouble breathing, this can be serious. If it is severe call 911 IMMEDIATELY. If it is mild, please call our office.   We will call to schedule your test 2-4 weeks out understanding that some insurance companies will need an authorization prior to the service being performed.   For non-scheduling related questions, please contact the cardiac imaging nurse navigator should you have any questions/concerns: Rockwell AlexandriaSara Wallace, Cardiac Imaging Nurse Navigator Larey BrickMerle Prescott, Cardiac Imaging Nurse Navigator [facility] Direct Office Dial: [phone]   For scheduling needs, including cancellations and rescheduling, please call [place], [phone].    WHY IS MY DOCTOR PRESCRIBING ZIO? The Zio system is proven and trusted by physicians to detect and diagnose irregular heart rhythms -- and has been prescribed to hundreds of thousands of patients.  The FDA has cleared the Zio system to monitor for many different kinds of irregular heart rhythms. In a study, physicians were able to reach a diagnosis 90% of the time with the Zio  system1.  You can wear the Zio monitor -- a small, discreet, comfortable patch -- during your normal day-to-day activity, including while you sleep, shower, and exercise, while it records every single heartbeat for analysis.  1Barrett, P., et al. Comparison of 24 Hour Holter Monitoring Versus 14 Day Novel Adhesive Patch Electrocardiographic Monitoring. American Journal of Medicine, 2014.  ZIO VS. HOLTER MONITORING The Zio monitor can be comfortably worn for up to 14 days. Holter monitors can be worn for 24 to 48 hours, limiting the time to record any irregular heart rhythms you may have. Zio is able to capture data for the 51% of patients who have their first symptom-triggered arrhythmia after 48 hours.1  LIVE WITHOUT RESTRICTIONS The Zio ambulatory cardiac monitor is a small, unobtrusive, and water-resistant patch--you might even forget you're wearing it. The Zio monitor records and stores every beat of your heart, whether you're sleeping, working out, or showering. Wear the monitor for 14 days, remove 07/09/22  Your physician has requested that you have an echocardiogram. Echocardiography is a painless test that uses sound waves to create images of your heart. It provides  your doctor with information about the size and shape of your heart and how well your heart's chambers and valves are working. This procedure takes approximately one hour. There are no restrictions for this procedure.    Follow-Up: At [facility], you and your health needs are our priority.  As part of our continuing mission to provide you with exceptional heart care, we have created designated Provider Care Teams.  These Care Teams include your primary Cardiologist (physician) and Advanced Practice Providers (APPs -  Physician Assistants and Nurse Practitioners) who all work together to provide you with the care you need, when you need it.  We recommend signing up for the patient portal called "MyChart".  Sign up  information is provided on this After Visit Summary.  MyChart is used to connect with patients for Virtual Visits (Telemedicine).  Patients are able to view lab/test results, encounter notes, upcoming appointments, etc.  Non-urgent messages can be sent to your provider as well.   To learn more about what you can do with MyChart, go to [email or web address].    Your next appointment:   6 month(s)  The format for your next appointment:   In Person  Provider:   Belva Crome, MD   Other Instructions Cardiac CT Angiogram A cardiac CT angiogram is a procedure to look at the heart and the area around the heart. It may be done to help find the cause of chest pains or other symptoms of heart disease. During this procedure, a substance called contrast dye is injected into the blood vessels in the area to be checked. A large X-ray machine, called a CT scanner, then takes detailed pictures of the heart and the surrounding area. The procedure is also sometimes called a coronary CT angiogram, coronary artery scanning, or CTA. A cardiac CT angiogram allows the health care provider to see how well blood is flowing to and from the heart. The health care provider will be able to see if there are any problems, such as: Blockage or narrowing of the coronary arteries in the heart. Fluid around the heart. Signs of weakness or disease in the muscles, valves, and tissues of the heart. Tell a health care provider about: Any allergies you have. This is especially important if you have had a previous allergic reaction to contrast dye. All medicines you are taking, including vitamins, herbs, eye drops, creams, and over-the-counter medicines. Any blood disorders you have. Any surgeries you have had. Any medical conditions you have. Whether you are pregnant or may be pregnant. Any anxiety disorders, chronic pain, or other conditions you have that may increase your stress or prevent you from lying still. What are  the risks? Generally, this is a safe procedure. However, problems may occur, including: Bleeding. Infection. Allergic reactions to medicines or dyes. Damage to other structures or organs. Kidney damage from the contrast dye that is used. Increased risk of cancer from radiation exposure. This risk is low. Talk with your health care provider about: The risks and benefits of testing. How you can receive the lowest dose of radiation. What happens before the procedure? Wear comfortable clothing and remove any jewelry, glasses, dentures, and hearing aids. Follow instructions from your health care provider about eating and drinking. This may include: For 12 hours before the procedure -- avoid caffeine. This includes tea, coffee, soda, energy drinks, and diet pills. Drink plenty of water or other fluids that do not have caffeine in them. Being well hydrated can prevent complications. For 4-6 hours  before the procedure -- stop eating and drinking. The contrast dye can cause nausea, but this is less likely if your stomach is empty. Ask your health care provider about changing or stopping your regular medicines. This is especially important if you are taking diabetes medicines, blood thinners, or medicines to treat problems with erections (erectile dysfunction). What happens during the procedure?  Hair on your chest may need to be removed so that small sticky patches called electrodes can be placed on your chest. These will transmit information that helps to monitor your heart during the procedure. An IV will be inserted into one of your veins. You might be given a medicine to control your heart rate during the procedure. This will help to ensure that good images are obtained. You will be asked to lie on an exam table. This table will slide in and out of the CT machine during the procedure. Contrast dye will be injected into the IV. You might feel warm, or you may get a metallic taste in your mouth. You  will be given a medicine called nitroglycerin. This will relax or dilate the arteries in your heart. The table that you are lying on will move into the CT machine tunnel for the scan. The person running the machine will give you instructions while the scans are being done. You may be asked to: Keep your arms above your head. Hold your breath. Stay very still, even if the table is moving. When the scanning is complete, you will be moved out of the machine. The IV will be removed. The procedure may vary among health care providers and hospitals. What can I expect after the procedure? After your procedure, it is common to have: A metallic taste in your mouth from the contrast dye. A feeling of warmth. A headache from the nitroglycerin. Follow these instructions at home: Take over-the-counter and prescription medicines only as told by your health care provider. If you are told, drink enough fluid to keep your urine pale yellow. This will help to flush the contrast dye out of your body. Most people can return to their normal activities right after the procedure. Ask your health care provider what activities are safe for you. It is up to you to get the results of your procedure. Ask your health care provider, or the department that is doing the procedure, when your results will be ready. Keep all follow-up visits as told by your health care provider. This is important. Contact a health care provider if: You have any symptoms of allergy to the contrast dye. These include: Shortness of breath. Rash or hives. A racing heartbeat. Summary A cardiac CT angiogram is a procedure to look at the heart and the area around the heart. It may be done to help find the cause of chest pains or other symptoms of heart disease. During this procedure, a large X-ray machine, called a CT scanner, takes detailed pictures of the heart and the surrounding area after a contrast dye has been injected into blood vessels in  the area. Ask your health care provider about changing or stopping your regular medicines before the procedure. This is especially important if you are taking diabetes medicines, blood thinners, or medicines to treat erectile dysfunction. If you are told, drink enough fluid to keep your urine pale yellow. This will help to flush the contrast dye out of your body. This information is not intended to replace advice given to you by your health care provider. Make sure  you discuss any questions you have with your health care provider. Document Revised: 04/29/2019 Document Reviewed: 04/29/2019 Elsevier Patient Education  [place].

## 2022-06-25 NOTE — Progress Notes (Signed)
Cardiology Office Note:    Date:  06/25/2022   ID:  Charles Esparza, DOB 09-19-1970, MRN [id number]  PCP:  Pcp, No  Cardiologist:  Garwin Brothers, MD   Referring MD: No ref. provider found    ASSESSMENT:    1. Dyspnea on exertion   2. Diabetes mellitus due to underlying condition with unspecified complications (HCC)   3. Panlobular emphysema (HCC)   4. Morbid obesity (HCC)   5. Essential hypertension   6. Palpitations   7. Cardiac murmur    PLAN:    In order of problems listed above:  Primary prevention stressed with the patient.  Importance of compliance with diet medication stressed he vocalized understanding. Dyspnea on exertion and chest pain as mentioned below: Could be multifactorial.  We need to evaluate him for coronary etiology in view of risk factors.  I suggested CT coronary angiography and is agreeable. Essential hypertension: Blood pressure is under control.  He tells me that his blood pressure stable at home.  He has an element of whitecoat hypertension.  He is done this by lifestyle modification.  He also is on a beta-blocker. Palpitations: We will do a 2-week monitor.  I also discussed with him about his cardia app purchase so he can monitor himself and forward results to [place].  He is agreeable. Diabetes mellitus and morbid obesity: Patient is also on statin therapy.  Lifestyle modification urged.  Diabetes mellitus is not in optimal control and I cautioned him about the risks of this and he understands. Cardiac murmur: Echocardiogram will be done to assess murmur heard on auscultation. He knows to go to the nearest emergency room for any significant concerns.Patient will be seen in follow-up appointment in 6 months or earlier if the patient has any concerns    Medication Adjustments/Labs and Tests Ordered: Current medicines are reviewed at length with the patient today.  Concerns regarding medicines are outlined above.  No orders of the defined types were placed  in this encounter.  No orders of the defined types were placed in this encounter.    History of Present Illness:    Charles Esparza is a 51 y.o. male who is being seen today for the evaluation of dyspnea on exertion and palpitations.  Patient is a pleasant 51 year old male.  He has past medical history of essential hypertension, mixed dyslipidemia, diabetes mellitus and morbid obesity.  He is here for dyspnea on exertion.  He leads a sedentary lifestyle.  He is concerned about his dyspnea.  He also mentions to me that he has chest tightness on exertion and chest pain.  No radiation to the neck or to the arms.  He also has palpitations which are significant and he tells me that it leaves him drained.  The last time this happened was a month ago.  No dizziness or syncope.  At the time of my evaluation, the patient is alert awake oriented and in no distress.  Past Medical History:  Diagnosis Date   Asthma    COPD (chronic obstructive pulmonary disease) (HCC)    Diabetes mellitus due to underlying condition with unspecified complications (HCC) 09/29/2018   Dyspnea on exertion 10/02/2018   Essential hypertension 09/29/2018   Morbid obesity (HCC) 09/29/2018   Pain in left knee 02/08/2022    Past Surgical History:  Procedure Laterality Date   LEFT HEART CATH AND CORONARY ANGIOGRAPHY N/A 10/02/2018   Procedure: LEFT HEART CATH AND CORONARY ANGIOGRAPHY;  Surgeon: Yvonne Kendall, MD;  Location:  [facility];  Service: Cardiovascular;  Laterality: N/A;    Current Medications: Current Meds  Medication Sig   acetaminophen (TYLENOL) 500 MG tablet Take 1,000 mg by mouth daily as needed for moderate pain or headache.   albuterol (PROVENTIL) (2.5 MG/3ML) 0.083% nebulizer solution USE 1 VIAL IN NEBULIZER THREE TIMES DAILY   aspirin EC 81 MG tablet Take 81 mg by mouth daily.   atorvastatin (LIPITOR) 20 MG tablet Take 20 mg by mouth daily.   fluticasone (FLONASE) 50 MCG/ACT nasal spray Place 1 spray  into both nostrils as needed for allergies or rhinitis.   furosemide (LASIX) 40 MG tablet Take 1 tablet by mouth once daily (Patient taking differently: Take 40 mg by mouth as needed for edema or fluid.)   glipiZIDE (GLUCOTROL XL) 10 MG 24 hr tablet Take 10 mg by mouth daily.   loratadine (CLARITIN) 10 MG tablet Take 10 mg by mouth daily as needed for allergies.   losartan (COZAAR) 25 MG tablet Take 25 mg by mouth every morning.   meloxicam (MOBIC) 15 MG tablet TAKE 1 TABLET (15 MG TOTAL) BY MOUTH DAILY.   metoprolol tartrate (LOPRESSOR) 50 MG tablet Take 1 tablet by mouth twice daily   nitroGLYCERIN (NITROSTAT) 0.4 MG SL tablet Place 0.4 mg under the tongue every 5 (five) minutes as needed for chest pain.   oxymetazoline (AFRIN) 0.05 % nasal spray Place 2 sprays into both nostrils 2 (two) times daily as needed for congestion.   Current Facility-Administered Medications for the 06/25/22 encounter (Office Visit) with Nickolaus Bordelon, Aundra Dubin, MD  Medication   0.9 %  sodium chloride infusion   bebtelovimab EUA injection SOLN 175 mg     Allergies:   Patient has no known allergies.   Social History   Socioeconomic History   Marital status: Married    Spouse name: Not on file   Number of children: Not on file   Years of education: Not on file   Highest education level: Not on file  Occupational History   Not on file  Tobacco Use   Smoking status: Never   Smokeless tobacco: Never  Substance and Sexual Activity   Alcohol use: Not on file   Drug use: Not on file   Sexual activity: Not on file  Other Topics Concern   Not on file  Social History Narrative   Not on file   Social Determinants of Health   Financial Resource Strain: Not on file  Food Insecurity: Not on file  Transportation Needs: Not on file  Physical Activity: Not on file  Stress: Not on file  Social Connections: Not on file     Family History: The patient's family history includes Cancer in his mother; Heart disease in  his father and mother; Hypertension in his father and mother. There is no history of Diabetes.  ROS:   Please see the history of present illness.    All other systems reviewed and are negative.  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  Recent Lipid Panel No results found for: "CHOL", "TRIG", "HDL", "CHOLHDL", "VLDL", "LDLCALC", "LDLDIRECT"  Physical Exam:    VS:  BP (!) 144/82   Pulse 76   Ht 5\' 11"  (1.803 m)   Wt (!) 369 lb (167.4 kg)   SpO2 95%   BMI  51.47 kg/m     Wt Readings from Last 3 Encounters:  06/25/22 (!) 369 lb (167.4 kg)  03/12/21 (!) 365 lb (165.6 kg)  11/10/18 (!) 375 lb (170.1 kg)     GEN: Patient is in no acute distress HEENT: Normal NECK: No JVD; No carotid bruits LYMPHATICS: No lymphadenopathy CARDIAC: S1 S2 regular, 2/6 systolic murmur at the apex. RESPIRATORY:  Clear to auscultation without rales, wheezing or rhonchi  ABDOMEN: Soft, non-tender, non-distended MUSCULOSKELETAL:  No edema; No deformity  SKIN: Warm and dry NEUROLOGIC:  Alert and oriented x 3 PSYCHIATRIC:  Normal affect    Signed, Jenean Lindau, MD  06/25/2022 2:17 PM    [facility]

## 2022-06-26 LAB — BASIC METABOLIC PANEL
BUN/Creatinine Ratio: 17 (ref 9–20)
BUN: 13 mg/dL (ref 6–24)
CO2: 23 mmol/L (ref 20–29)
Calcium: 9.5 mg/dL (ref 8.7–10.2)
Chloride: 97 mmol/L (ref 96–106)
Creatinine, Ser: 0.78 mg/dL (ref 0.76–1.27)
Glucose: 355 mg/dL — ABNORMAL HIGH (ref 70–99)
Potassium: 4.4 mmol/L (ref 3.5–5.2)
Sodium: 135 mmol/L (ref 134–144)
eGFR: 107 mL/min/{1.73_m2} (ref >59)

## 2022-07-04 ENCOUNTER — Ambulatory Visit: Payer: Medicare Other

## 2022-07-04 ENCOUNTER — Telehealth: Payer: Self-pay | Admitting: Cardiology

## 2022-07-04 NOTE — Telephone Encounter (Signed)
Please call to reschedule this echo.  Advised that he can have CT scan without echo being done. Pt verbalized understanding and had no additional questions.

## 2022-07-04 NOTE — Telephone Encounter (Signed)
Patient stated he will need to reschedule today's Echo test but wants to know if he should reschedule the CT Cardiac Morph test as well.

## 2022-07-06 ENCOUNTER — Telehealth (HOSPITAL_COMMUNITY): Payer: Self-pay | Admitting: *Deleted

## 2022-07-06 NOTE — Telephone Encounter (Signed)
Reaching out to patient to offer assistance regarding upcoming cardiac imaging study; pt verbalizes understanding of appt date/time, parking situation and where to check in, medications ordered, and verified current allergies; name and call back number provided for further questions should they arise  Rance Smithson RN Navigator Cardiac Imaging [facility] [phone] office [phone] cell  Patient to take 100mg metoprolol tartrate two hours prior to his cardiac CT scan. He is aware to arrive at 3:30pm. 

## 2022-07-09 ENCOUNTER — Ambulatory Visit (HOSPITAL_COMMUNITY)
Admission: RE | Admit: 2022-07-09 | Discharge: 2022-07-09 | Disposition: A | Discharge status: Home / Self Care | Payer: Medicare Other | Source: Ambulatory Visit | Attending: Cardiology | Admitting: Cardiology

## 2022-07-09 DIAGNOSIS — R072 Precordial pain: Secondary | ICD-10-CM | POA: Diagnosis not present

## 2022-07-09 DIAGNOSIS — R0609 Other forms of dyspnea: Secondary | ICD-10-CM | POA: Diagnosis not present

## 2022-07-09 DIAGNOSIS — R079 Chest pain, unspecified: Secondary | ICD-10-CM | POA: Diagnosis not present

## 2022-07-09 DIAGNOSIS — I2510 Atherosclerotic heart disease of native coronary artery without angina pectoris: Secondary | ICD-10-CM | POA: Diagnosis not present

## 2022-07-09 MED ORDER — METOPROLOL TARTRATE 5 MG/5ML IV SOLN
INTRAVENOUS | Status: AC
  Administered 2022-07-09: 10 mg via INTRAVENOUS
  Filled 2022-07-09: qty 10

## 2022-07-09 MED ORDER — METOPROLOL TARTRATE 5 MG/5ML IV SOLN
INTRAVENOUS | Status: AC
  Filled 2022-07-09: qty 10

## 2022-07-09 MED ORDER — METOPROLOL TARTRATE 5 MG/5ML IV SOLN
10.0000 mg | Freq: Once | INTRAVENOUS | Status: AC
  Administered 2022-07-09: 10 mg via INTRAVENOUS

## 2022-07-09 MED ORDER — METOPROLOL TARTRATE 5 MG/5ML IV SOLN: 10.0000 mg | Freq: Once | INTRAVENOUS | Status: AC

## 2022-07-09 MED ORDER — NITROGLYCERIN 0.4 MG SL SUBL
SUBLINGUAL_TABLET | SUBLINGUAL | Status: AC
  Administered 2022-07-09: 0.8 mg via SUBLINGUAL
  Filled 2022-07-09: qty 2

## 2022-07-09 MED ORDER — NITROGLYCERIN 0.4 MG SL SUBL: 0.8000 mg | SUBLINGUAL_TABLET | Freq: Once | SUBLINGUAL | Status: AC

## 2022-07-09 MED ORDER — IOHEXOL 350 MG/ML SOLN
100.0000 mL | Freq: Once | INTRAVENOUS | Status: AC | PRN
  Administered 2022-07-09: 100 mL via INTRAVENOUS

## 2022-07-11 ENCOUNTER — Telehealth: Payer: Self-pay

## 2022-07-11 ENCOUNTER — Other Ambulatory Visit: Payer: Self-pay

## 2022-07-11 DIAGNOSIS — I2510 Atherosclerotic heart disease of native coronary artery without angina pectoris: Secondary | ICD-10-CM

## 2022-07-11 NOTE — Telephone Encounter (Signed)
-----   Message from Jenean Lindau, MD sent at 07/10/2022  4:24 PM EDT ----- Needs appt to discuss. Make sure he is on ecasa and NTG. Get labs chem7 and LL before visit. Cc pcp Jenean Lindau, MD 07/10/2022 4:23 PM

## 2022-07-18 ENCOUNTER — Telehealth: Payer: Self-pay | Admitting: Cardiology

## 2022-07-18 ENCOUNTER — Ambulatory Visit: Payer: Medicare Other | Attending: Cardiology

## 2022-07-18 DIAGNOSIS — R011 Cardiac murmur, unspecified: Secondary | ICD-10-CM | POA: Diagnosis not present

## 2022-07-18 DIAGNOSIS — R0609 Other forms of dyspnea: Secondary | ICD-10-CM

## 2022-07-18 LAB — ECHOCARDIOGRAM COMPLETE
Area-P 1/2: 5.13 cm2
MV M vel: 5.7 m/s
MV Peak grad: 130 mmHg
S' Lateral: 3.3 cm

## 2022-07-18 NOTE — Telephone Encounter (Signed)
Pt states he just had a ct done and wants to know if he needs to have his echo done today at 3:00pm or can he cancel it.

## 2022-07-18 NOTE — Telephone Encounter (Signed)
Patient informed to continue as scheduled.

## 2022-07-19 DIAGNOSIS — R002 Palpitations: Secondary | ICD-10-CM | POA: Diagnosis not present

## 2022-07-20 ENCOUNTER — Encounter: Payer: Self-pay | Admitting: Cardiology

## 2022-07-20 ENCOUNTER — Ambulatory Visit: Payer: Medicare Other | Attending: Cardiology | Admitting: Cardiology

## 2022-07-20 VITALS — BP 142/84 | HR 84 | Ht 71.0 in | Wt 365.0 lb

## 2022-07-20 DIAGNOSIS — I10 Essential (primary) hypertension: Secondary | ICD-10-CM

## 2022-07-20 DIAGNOSIS — I209 Angina pectoris, unspecified: Secondary | ICD-10-CM | POA: Diagnosis not present

## 2022-07-20 DIAGNOSIS — I2510 Atherosclerotic heart disease of native coronary artery without angina pectoris: Secondary | ICD-10-CM

## 2022-07-20 DIAGNOSIS — E088 Diabetes mellitus due to underlying condition with unspecified complications: Secondary | ICD-10-CM

## 2022-07-20 HISTORY — DX: Angina pectoris, unspecified: I20.9

## 2022-07-20 HISTORY — DX: Atherosclerotic heart disease of native coronary artery without angina pectoris: I25.10

## 2022-07-20 NOTE — Addendum Note (Signed)
Addended by: Jerl Santos R on: 07/20/2022 03:52 PM   Modules accepted: Orders

## 2022-07-20 NOTE — H&P (View-Only) (Signed)
Cardiology Office Note:    Date:  07/20/2022   ID:  Charles Esparza, DOB 01/30/1971, MRN [id number]  PCP:  Pcp, No  Cardiologist:  Imaan Padgett R Krishawna Stiefel, MD   Referring MD: No ref. provider found    ASSESSMENT:    1. Essential hypertension   2. Diabetes mellitus due to underlying condition with unspecified complications (HCC)   3. Morbid obesity (HCC)   4. Coronary artery disease involving native coronary artery of native heart without angina pectoris   5. Angina pectoris (HCC)    PLAN:    In order of problems listed above:  Angina pectoris: Coronary artery disease: Secondary prevention stressed with the patient.  Importance of compliance with diet medication stressed any vocalized understanding.  In view of symptoms which are concerning and abnormal CT coronary angiography I discussed the following.I discussed coronary angiography and left heart catheterization with the patient at extensive length. Procedure, benefits and potential risks were explained. Patient had multiple questions which were answered to the patient's satisfaction. Patient agreed and consented for the procedure. Further recommendations will be made based on the findings of the coronary angiography. In the interim. The patient has any significant symptoms he knows to go to the nearest emergency room.  Patient was advised to take a coated baby aspirin on a daily basis.  Sublingual nitroglycerin prescription was sent, its protocol and 911 protocol explained and the patient vocalized understanding questions were answered to the patient's satisfaction Essential hypertension: Blood pressure stable and diet was emphasized. Mixed dyslipidemia, diabetes mellitus and obesity: Lifestyle modification urged and he promises to do better.  Risks of obesity explained to Patient will be seen in follow-up appointment in 6 months or earlier if the patient has any concerns    Medication Adjustments/Labs and Tests Ordered: Current medicines  are reviewed at length with the patient today.  Concerns regarding medicines are outlined above.  No orders of the defined types were placed in this encounter.  No orders of the defined types were placed in this encounter.    No chief complaint on file.    History of Present Illness:    Charles Esparza is a 51 y.o. male.  Patient has past medical history of essential hypertension, dyslipidemia, diabetes mellitus and morbid obesity.  Patient had CT coronary angiography which is significant.  He also gives history suggesting for angina.  He leads a sedentary lifestyle.  He is morbid obesity.  At the time of my evaluation, the patient is alert awake oriented and in no distress.  Past Medical History:  Diagnosis Date   Asthma    Cardiac murmur 06/25/2022   COPD (chronic obstructive pulmonary disease) (HCC)    Diabetes mellitus due to underlying condition with unspecified complications (HCC) 09/29/2018   Dyspnea on exertion 10/02/2018   Essential hypertension 09/29/2018   Morbid obesity (HCC) 09/29/2018   Pain in left knee 02/08/2022   Palpitations 06/25/2022    Past Surgical History:  Procedure Laterality Date   LEFT HEART CATH AND CORONARY ANGIOGRAPHY N/A 10/02/2018   Procedure: LEFT HEART CATH AND CORONARY ANGIOGRAPHY;  Surgeon: End, Christopher, MD;  Location: [facility];  Service: Cardiovascular;  Laterality: N/A;    Current Medications: Current Meds  Medication Sig   acetaminophen (TYLENOL) 500 MG tablet Take 1,000 mg by mouth daily as needed for moderate pain or headache.   albuterol (PROVENTIL) (2.5 MG/3ML) 0.083% nebulizer solution USE 1 VIAL IN NEBULIZER THREE TIMES DAILY   aspirin EC 81 MG   tablet Take 81 mg by mouth daily.   atorvastatin (LIPITOR) 20 MG tablet Take 20 mg by mouth daily.   fluticasone (FLONASE) 50 MCG/ACT nasal spray Place 1 spray into both nostrils as needed for allergies or rhinitis.   furosemide (LASIX) 40 MG tablet Take 1 tablet by mouth once daily  (Patient taking differently: Take 40 mg by mouth as needed for fluid or edema.)   glipiZIDE (GLUCOTROL XL) 10 MG 24 hr tablet Take 10 mg by mouth daily.   loratadine (CLARITIN) 10 MG tablet Take 10 mg by mouth daily as needed for allergies.   losartan (COZAAR) 25 MG tablet Take 25 mg by mouth every morning.   meloxicam (MOBIC) 15 MG tablet TAKE 1 TABLET (15 MG TOTAL) BY MOUTH DAILY.   metoprolol tartrate (LOPRESSOR) 50 MG tablet Take 1 tablet by mouth twice daily   nitroGLYCERIN (NITROSTAT) 0.4 MG SL tablet Place 0.4 mg under the tongue every 5 (five) minutes as needed for chest pain.   oxymetazoline (AFRIN) 0.05 % nasal spray Place 2 sprays into both nostrils 2 (two) times daily as needed for congestion.   Current Facility-Administered Medications for the 07/20/22 encounter (Office Visit) with Angelyne Terwilliger, Reita Cliche, MD  Medication   0.9 %  sodium chloride infusion   bebtelovimab EUA injection SOLN 175 mg     Allergies:   Patient has no known allergies.   Social History   Socioeconomic History   Marital status: Single    Spouse name: Not on file   Number of children: Not on file   Years of education: Not on file   Highest education level: Not on file  Occupational History   Not on file  Tobacco Use   Smoking status: Never   Smokeless tobacco: Never  Substance and Sexual Activity   Alcohol use: Not on file   Drug use: Not on file   Sexual activity: Not on file  Other Topics Concern   Not on file  Social History Narrative   Not on file   Social Determinants of Health   Financial Resource Strain: Not on file  Food Insecurity: Not on file  Transportation Needs: Not on file  Physical Activity: Not on file  Stress: Not on file  Social Connections: Not on file     Family History: The patient's family history includes Cancer in his mother; Heart disease in his father and mother; Hypertension in his father and mother. There is no history of Diabetes.  ROS:   Please see the  history of present illness.    All other systems reviewed and are negative.  EKGs/Labs/Other Studies Reviewed:    The following studies were reviewed today: I discussed my findings with the patient at length.  Primary   Recent Labs: 06/25/2022: BUN 13; Creatinine, Ser 0.78; Potassium 4.4; Sodium 135  Recent Lipid Panel No results found for: "CHOL", "TRIG", "HDL", "CHOLHDL", "VLDL", "LDLCALC", "LDLDIRECT"  Physical Exam:    VS:  BP (!) 142/84   Pulse 84   Ht 5\' 11"  (1.803 m)   Wt (!) 365 lb (165.6 kg)   SpO2 94%   BMI 50.91 kg/m     Wt Readings from Last 3 Encounters:  07/20/22 (!) 365 lb (165.6 kg)  06/25/22 (!) 369 lb (167.4 kg)  03/12/21 (!) 365 lb (165.6 kg)     GEN: Patient is in no acute distress HEENT: Normal NECK: No JVD; No carotid bruits LYMPHATICS: No lymphadenopathy CARDIAC: Hear sounds regular, 2/6 systolic murmur at  the apex. RESPIRATORY:  Clear to auscultation without rales, wheezing or rhonchi  ABDOMEN: Soft, non-tender, non-distended MUSCULOSKELETAL:  No edema; No deformity  SKIN: Warm and dry NEUROLOGIC:  Alert and oriented x 3 PSYCHIATRIC:  Normal affect   Signed, Jenean Lindau, MD  07/20/2022 3:38 PM     [facility]

## 2022-07-20 NOTE — Patient Instructions (Signed)
Medication Instructions: Your physician recommends that you continue on your current medications as directed. Please refer to the Current Medication list given to you today.  Your physician recommends that you continue on your current medications as directed. Please refer to the Current Medication list given to you today. s *If you need a refill on your cardiac medications before your next appointment, please call your pharmacy*   Lab Work: Your physician recommends that you return for lab work in: Today for BMP and CBC  If you have labs (blood work) drawn today and your tests are completely normal, you will receive your results only by: MyChart Message (if you have MyChart) OR A paper copy in the mail If you have any lab test that is abnormal or we need to change your treatment, we will call you to review the results.   Testing/Procedures:  [place] [facility] DE[facility] OF [place] [facility] DE[facility] OF [facility] [place] [id number] Dept: [phone] Loc: [phone]  Charles Esparza  07/20/2022  You are scheduled for a Cardiac Catheterization on Wednesday, November 8 with Dr. Shelva Majestic.  1. Please arrive at the [facility] (Main Entrance A) at [facility]: [address] [place], [place] [address] at 8:00 AM (This time is two hours before your procedure to ensure your preparation). Free valet parking service is available.   Special note: Every effort is made to have your procedure done on time. Please understand that emergencies sometimes delay scheduled procedures.  2. Diet: Do not eat solid foods after midnight.  The patient may have clear liquids until 5am upon the day of the procedure.  3. Labs: Today   4. Medication instructions in preparation for your procedure:   Contrast Allergy: No     24 Hour hold on Losartan Hold Furosemide day of test No Erectile dysfunction meds 24 hours  prior to test      On the morning of your procedure, take your Aspirin 81 mg and any morning medicines NOT listed above.  You may use sips of water.  5. Plan for one night stay--bring personal belongings. 6. Bring a current list of your medications and current insurance cards. 7. You MUST have a responsible person to drive you home. 8. Someone MUST be with you the first 24 hours after you arrive home or your discharge will be delayed. 9. Please wear clothes that are easy to get on and off and wear slip-on shoes.  Thank you for allowing [place] to care for you!   -- [facility]     Follow-Up: At [facility], you and your health needs are our priority.  As part of our continuing mission to provide you with exceptional heart care, we have created designated Provider Care Teams.  These Care Teams include your primary Cardiologist (physician) and Advanced Practice Providers (APPs -  Physician Assistants and Nurse Practitioners) who all work together to provide you with the care you need, when you need it.  We recommend signing up for the patient portal called "MyChart".  Sign up information is provided on this After Visit Summary.  MyChart is used to connect with patients for Virtual Visits (Telemedicine).  Patients are able to view lab/test results, encounter notes, upcoming appointments, etc.  Non-urgent messages can be sent to your provider as well.   To learn more about what you can do with MyChart,  go to [email or web address].    Your next appointment:   4 week(s)  The format for your next appointment:   In Person  Provider:   Belva Crome, MD    Other Instructions   Important Information About Sugar

## 2022-07-20 NOTE — Progress Notes (Signed)
Cardiology Office Note:    Date:  07/20/2022   ID:  Charles Esparza, DOB 15-Dec-1970, MRN [id number]  PCP:  Pcp, No  Cardiologist:  Jenean Lindau, MD   Referring MD: No ref. provider found    ASSESSMENT:    1. Essential hypertension   2. Diabetes mellitus due to underlying condition with unspecified complications ([place])   3. Morbid obesity ([place])   4. Coronary artery disease involving native coronary artery of native heart without angina pectoris   5. Angina pectoris ([place])    PLAN:    In order of problems listed above:  Angina pectoris: Coronary artery disease: Secondary prevention stressed with the patient.  Importance of compliance with diet medication stressed any vocalized understanding.  In view of symptoms which are concerning and abnormal CT coronary angiography I discussed the following.I discussed coronary angiography and left heart catheterization with the patient at extensive length. Procedure, benefits and potential risks were explained. Patient had multiple questions which were answered to the patient's satisfaction. Patient agreed and consented for the procedure. Further recommendations will be made based on the findings of the coronary angiography. In the interim. The patient has any significant symptoms he knows to go to the nearest emergency room.  Patient was advised to take a coated baby aspirin on a daily basis.  Sublingual nitroglycerin prescription was sent, its protocol and 911 protocol explained and the patient vocalized understanding questions were answered to the patient's satisfaction Essential hypertension: Blood pressure stable and diet was emphasized. Mixed dyslipidemia, diabetes mellitus and obesity: Lifestyle modification urged and he promises to do better.  Risks of obesity explained to Patient will be seen in follow-up appointment in 6 months or earlier if the patient has any concerns    Medication Adjustments/Labs and Tests Ordered: Current medicines  are reviewed at length with the patient today.  Concerns regarding medicines are outlined above.  No orders of the defined types were placed in this encounter.  No orders of the defined types were placed in this encounter.    No chief complaint on file.    History of Present Illness:    Charles Esparza is a 51 y.o. male.  Patient has past medical history of essential hypertension, dyslipidemia, diabetes mellitus and morbid obesity.  Patient had CT coronary angiography which is significant.  He also gives history suggesting for angina.  He leads a sedentary lifestyle.  He is morbid obesity.  At the time of my evaluation, the patient is alert awake oriented and in no distress.  Past Medical History:  Diagnosis Date   Asthma    Cardiac murmur 06/25/2022   COPD (chronic obstructive pulmonary disease) ([place])    Diabetes mellitus due to underlying condition with unspecified complications ([place]) [id number]   Dyspnea on exertion 10/02/2018   Essential hypertension 09/29/2018   Morbid obesity ([place]) 09/29/2018   Pain in left knee 02/08/2022   Palpitations 06/25/2022    Past Surgical History:  Procedure Laterality Date   LEFT HEART CATH AND CORONARY ANGIOGRAPHY N/A 10/02/2018   Procedure: LEFT HEART CATH AND CORONARY ANGIOGRAPHY;  Surgeon: Nelva Bush, MD;  Location: [place] [facility];  Service: Cardiovascular;  Laterality: N/A;    Current Medications: Current Meds  Medication Sig   acetaminophen (TYLENOL) 500 MG tablet Take 1,000 mg by mouth daily as needed for moderate pain or headache.   albuterol (PROVENTIL) (2.5 MG/3ML) 0.083% nebulizer solution USE 1 VIAL IN NEBULIZER THREE TIMES DAILY   aspirin EC 81 MG  tablet Take 81 mg by mouth daily.   atorvastatin (LIPITOR) 20 MG tablet Take 20 mg by mouth daily.   fluticasone (FLONASE) 50 MCG/ACT nasal spray Place 1 spray into both nostrils as needed for allergies or rhinitis.   furosemide (LASIX) 40 MG tablet Take 1 tablet by mouth once daily  (Patient taking differently: Take 40 mg by mouth as needed for fluid or edema.)   glipiZIDE (GLUCOTROL XL) 10 MG 24 hr tablet Take 10 mg by mouth daily.   loratadine (CLARITIN) 10 MG tablet Take 10 mg by mouth daily as needed for allergies.   losartan (COZAAR) 25 MG tablet Take 25 mg by mouth every morning.   meloxicam (MOBIC) 15 MG tablet TAKE 1 TABLET (15 MG TOTAL) BY MOUTH DAILY.   metoprolol tartrate (LOPRESSOR) 50 MG tablet Take 1 tablet by mouth twice daily   nitroGLYCERIN (NITROSTAT) 0.4 MG SL tablet Place 0.4 mg under the tongue every 5 (five) minutes as needed for chest pain.   oxymetazoline (AFRIN) 0.05 % nasal spray Place 2 sprays into both nostrils 2 (two) times daily as needed for congestion.   Current Facility-Administered Medications for the 07/20/22 encounter (Office Visit) with Oral Remache, Reita Cliche, MD  Medication   0.9 %  sodium chloride infusion   bebtelovimab EUA injection SOLN 175 mg     Allergies:   Patient has no known allergies.   Social History   Socioeconomic History   Marital status: Single    Spouse name: Not on file   Number of children: Not on file   Years of education: Not on file   Highest education level: Not on file  Occupational History   Not on file  Tobacco Use   Smoking status: Never   Smokeless tobacco: Never  Substance and Sexual Activity   Alcohol use: Not on file   Drug use: Not on file   Sexual activity: Not on file  Other Topics Concern   Not on file  Social History Narrative   Not on file   Social Determinants of Health   Financial Resource Strain: Not on file  Food Insecurity: Not on file  Transportation Needs: Not on file  Physical Activity: Not on file  Stress: Not on file  Social Connections: Not on file     Family History: The patient's family history includes Cancer in his mother; Heart disease in his father and mother; Hypertension in his father and mother. There is no history of Diabetes.  ROS:   Please see the  history of present illness.    All other systems reviewed and are negative.  EKGs/Labs/Other Studies Reviewed:    The following studies were reviewed today: I discussed my findings with the patient at length.  Primary   Recent Labs: 06/25/2022: BUN 13; Creatinine, Ser 0.78; Potassium 4.4; Sodium 135  Recent Lipid Panel No results found for: "CHOL", "TRIG", "HDL", "CHOLHDL", "VLDL", "LDLCALC", "LDLDIRECT"  Physical Exam:    VS:  BP (!) 142/84   Pulse 84   Ht 5\' 11"  (1.803 m)   Wt (!) 365 lb (165.6 kg)   SpO2 94%   BMI 50.91 kg/m     Wt Readings from Last 3 Encounters:  07/20/22 (!) 365 lb (165.6 kg)  06/25/22 (!) 369 lb (167.4 kg)  03/12/21 (!) 365 lb (165.6 kg)     GEN: Patient is in no acute distress HEENT: Normal NECK: No JVD; No carotid bruits LYMPHATICS: No lymphadenopathy CARDIAC: Hear sounds regular, 2/6 systolic murmur at  the apex. RESPIRATORY:  Clear to auscultation without rales, wheezing or rhonchi  ABDOMEN: Soft, non-tender, non-distended MUSCULOSKELETAL:  No edema; No deformity  SKIN: Warm and dry NEUROLOGIC:  Alert and oriented x 3 PSYCHIATRIC:  Normal affect   Signed, Jenean Lindau, MD  07/20/2022 3:38 PM    [employer]

## 2022-07-21 LAB — BASIC METABOLIC PANEL
BUN/Creatinine Ratio: 20 (ref 9–20)
BUN: 13 mg/dL (ref 6–24)
CO2: 25 mmol/L (ref 20–29)
Calcium: 9.1 mg/dL (ref 8.7–10.2)
Chloride: 97 mmol/L (ref 96–106)
Creatinine, Ser: 0.64 mg/dL — ABNORMAL LOW (ref 0.76–1.27)
Glucose: 275 mg/dL — ABNORMAL HIGH (ref 70–99)
Potassium: 4.5 mmol/L (ref 3.5–5.2)
Sodium: 136 mmol/L (ref 134–144)
eGFR: 113 mL/min/{1.73_m2} (ref >59)

## 2022-07-21 LAB — CBC WITH DIFFERENTIAL/PLATELET
Basophils Absolute: 0 10*3/uL (ref 0.0–0.2)
Basos: 0 %
EOS (ABSOLUTE): 0.1 10*3/uL (ref 0.0–0.4)
Eos: 1 %
Hematocrit: 43.4 % (ref 37.5–51.0)
Hemoglobin: 15 g/dL (ref 13.0–17.7)
Immature Grans (Abs): 0 10*3/uL (ref 0.0–0.1)
Immature Granulocytes: 0 %
Lymphocytes Absolute: 1.9 10*3/uL (ref 0.7–3.1)
Lymphs: 24 %
MCH: 28.7 pg (ref 26.6–33.0)
MCHC: 34.6 g/dL (ref 31.5–35.7)
MCV: 83 fL (ref 79–97)
Monocytes Absolute: 0.6 10*3/uL (ref 0.1–0.9)
Monocytes: 7 %
Neutrophils Absolute: 5.5 10*3/uL (ref 1.4–7.0)
Neutrophils: 68 %
Platelets: 230 10*3/uL (ref 150–450)
RBC: 5.23 x10E6/uL (ref 4.14–5.80)
RDW: 12.7 % (ref 11.6–15.4)
WBC: 8.1 10*3/uL (ref 3.4–10.8)

## 2022-07-24 ENCOUNTER — Telehealth: Payer: Self-pay | Admitting: *Deleted

## 2022-07-24 NOTE — Telephone Encounter (Signed)
Call placed to patient to review procedure instructions, unable to leave message.

## 2022-07-24 NOTE — Telephone Encounter (Signed)
Cardiac Catheterization scheduled at [facility] for: Wednesday July 25, 2022 10 AM Arrival time and place: [facility] Main Entrance A at: 8 AM  Nothing to eat after midnight prior to procedure, clear liquids until 5 AM day of procedure.  Medication instructions: -Hold:  Lasix-AM of procedure  Glipizide-AM of procedure -Except hold medications usual morning medications can be taken with sips of water including aspirin 81 mg.  Confirmed patient has responsible adult to drive home post procedure and be with patient first 24 hours after arriving home.  Patient reports no new symptoms concerning for COVID-19 in the past 10 days.  Call placed to patient to review procedure instructions, unable to leave message, voicemail box  full.

## 2022-07-24 NOTE — Telephone Encounter (Signed)
Reviewed procedure instructions with patient.  

## 2022-07-25 ENCOUNTER — Ambulatory Visit (HOSPITAL_COMMUNITY)
Admission: RE | Admit: 2022-07-25 | Discharge: 2022-07-25 | Disposition: A | Discharge status: Home / Self Care | Payer: Medicare Other | Attending: Cardiovascular Disease | Admitting: Cardiovascular Disease

## 2022-07-25 ENCOUNTER — Other Ambulatory Visit: Payer: Self-pay

## 2022-07-25 ENCOUNTER — Encounter (HOSPITAL_COMMUNITY)
Admission: RE | Disposition: A | Discharge status: Home / Self Care | Payer: Self-pay | Source: Home / Self Care | Attending: Cardiovascular Disease

## 2022-07-25 DIAGNOSIS — E785 Hyperlipidemia, unspecified: Secondary | ICD-10-CM | POA: Insufficient documentation

## 2022-07-25 DIAGNOSIS — E119 Type 2 diabetes mellitus without complications: Secondary | ICD-10-CM | POA: Insufficient documentation

## 2022-07-25 DIAGNOSIS — E088 Diabetes mellitus due to underlying condition with unspecified complications: Secondary | ICD-10-CM

## 2022-07-25 DIAGNOSIS — I25119 Atherosclerotic heart disease of native coronary artery with unspecified angina pectoris: Secondary | ICD-10-CM | POA: Diagnosis not present

## 2022-07-25 DIAGNOSIS — Z7984 Long term (current) use of oral hypoglycemic drugs: Secondary | ICD-10-CM | POA: Diagnosis not present

## 2022-07-25 DIAGNOSIS — Z79899 Other long term (current) drug therapy: Secondary | ICD-10-CM | POA: Insufficient documentation

## 2022-07-25 DIAGNOSIS — R931 Abnormal findings on diagnostic imaging of heart and coronary circulation: Secondary | ICD-10-CM | POA: Diagnosis present

## 2022-07-25 DIAGNOSIS — I10 Essential (primary) hypertension: Secondary | ICD-10-CM

## 2022-07-25 DIAGNOSIS — I209 Angina pectoris, unspecified: Secondary | ICD-10-CM

## 2022-07-25 DIAGNOSIS — Z6841 Body Mass Index (BMI) 40.0 and over, adult: Secondary | ICD-10-CM | POA: Insufficient documentation

## 2022-07-25 DIAGNOSIS — I2510 Atherosclerotic heart disease of native coronary artery without angina pectoris: Secondary | ICD-10-CM

## 2022-07-25 HISTORY — PX: LEFT HEART CATH AND CORONARY ANGIOGRAPHY: CATH118249

## 2022-07-25 LAB — GLUCOSE, CAPILLARY
Glucose-Capillary: 300 mg/dL — ABNORMAL HIGH (ref 70–99)
Glucose-Capillary: 264 mg/dL — ABNORMAL HIGH (ref 70–99)

## 2022-07-25 SURGERY — LEFT HEART CATH AND CORONARY ANGIOGRAPHY
Anesthesia: LOCAL

## 2022-07-25 MED ORDER — HYDRALAZINE HCL 20 MG/ML IJ SOLN: 10.0000 mg | INTRAMUSCULAR | Status: DC | PRN

## 2022-07-25 MED ORDER — FENTANYL CITRATE (PF) 100 MCG/2ML IJ SOLN
INTRAMUSCULAR | Status: DC | PRN
  Administered 2022-07-25: 50 ug via INTRAVENOUS

## 2022-07-25 MED ORDER — HEPARIN (PORCINE) IN NACL 1000-0.9 UT/500ML-% IV SOLN
INTRAVENOUS | Status: DC | PRN
  Administered 2022-07-25 (×2): 500 mL

## 2022-07-25 MED ORDER — IOHEXOL 350 MG/ML SOLN
INTRAVENOUS | Status: DC | PRN
  Administered 2022-07-25: 75 mL

## 2022-07-25 MED ORDER — HEPARIN (PORCINE) IN NACL 1000-0.9 UT/500ML-% IV SOLN
INTRAVENOUS | Status: AC
  Filled 2022-07-25: qty 1000

## 2022-07-25 MED ORDER — SODIUM CHLORIDE 0.9 % IV SOLN: 250.0000 mL | INTRAVENOUS | Status: DC | PRN

## 2022-07-25 MED ORDER — SODIUM CHLORIDE 0.9% FLUSH: 3.0000 mL | INTRAVENOUS | Status: DC | PRN

## 2022-07-25 MED ORDER — ASPIRIN 81 MG PO CHEW: 81.0000 mg | CHEWABLE_TABLET | Freq: Every day | ORAL | Status: DC

## 2022-07-25 MED ORDER — MIDAZOLAM HCL 2 MG/2ML IJ SOLN
INTRAMUSCULAR | Status: DC | PRN
  Administered 2022-07-25: 2 mg via INTRAVENOUS

## 2022-07-25 MED ORDER — INSULIN ASPART 100 UNIT/ML IJ SOLN
5.0000 [IU] | Freq: Once | INTRAMUSCULAR | Status: AC
  Administered 2022-07-25: 5 [IU] via SUBCUTANEOUS
  Filled 2022-07-25: qty 1

## 2022-07-25 MED ORDER — HEPARIN SODIUM (PORCINE) 1000 UNIT/ML IJ SOLN
INTRAMUSCULAR | Status: AC
  Filled 2022-07-25: qty 10

## 2022-07-25 MED ORDER — AMLODIPINE BESYLATE 5 MG PO TABS
5.0000 mg | ORAL_TABLET | Freq: Every day | ORAL | Status: DC
  Administered 2022-07-25: 5 mg via ORAL
  Filled 2022-07-25: qty 1

## 2022-07-25 MED ORDER — VERAPAMIL HCL 2.5 MG/ML IV SOLN
INTRAVENOUS | Status: AC
  Filled 2022-07-25: qty 2

## 2022-07-25 MED ORDER — LIDOCAINE HCL (PF) 1 % IJ SOLN
INTRAMUSCULAR | Status: DC | PRN
  Administered 2022-07-25: 2 mL

## 2022-07-25 MED ORDER — FENTANYL CITRATE (PF) 100 MCG/2ML IJ SOLN
INTRAMUSCULAR | Status: AC
  Filled 2022-07-25: qty 2

## 2022-07-25 MED ORDER — AMLODIPINE BESYLATE 5 MG PO TABS: 5.0000 mg | ORAL_TABLET | Freq: Every day | ORAL | 11 refills | Status: DC

## 2022-07-25 MED ORDER — SODIUM CHLORIDE 0.9 % WEIGHT BASED INFUSION: 1.0000 mL/kg/h | INTRAVENOUS | Status: DC

## 2022-07-25 MED ORDER — MIDAZOLAM HCL 2 MG/2ML IJ SOLN
INTRAMUSCULAR | Status: AC
  Filled 2022-07-25: qty 2

## 2022-07-25 MED ORDER — SODIUM CHLORIDE 0.9 % WEIGHT BASED INFUSION
3.0000 mL/kg/h | INTRAVENOUS | Status: AC
  Administered 2022-07-25: 3 mL/kg/h via INTRAVENOUS

## 2022-07-25 MED ORDER — HEPARIN SODIUM (PORCINE) 1000 UNIT/ML IJ SOLN
INTRAMUSCULAR | Status: DC | PRN
  Administered 2022-07-25: 8000 [IU] via INTRAVENOUS

## 2022-07-25 MED ORDER — LABETALOL HCL 5 MG/ML IV SOLN: 10.0000 mg | INTRAVENOUS | Status: DC | PRN

## 2022-07-25 MED ORDER — SODIUM CHLORIDE 0.9% FLUSH: 3.0000 mL | Freq: Two times a day (BID) | INTRAVENOUS | Status: DC

## 2022-07-25 MED ORDER — SODIUM CHLORIDE 0.9 % IV SOLN: INTRAVENOUS | Status: DC

## 2022-07-25 MED ORDER — LIDOCAINE HCL (PF) 1 % IJ SOLN
INTRAMUSCULAR | Status: AC
  Filled 2022-07-25: qty 30

## 2022-07-25 MED ORDER — VERAPAMIL HCL 2.5 MG/ML IV SOLN
INTRAVENOUS | Status: DC | PRN
  Administered 2022-07-25: 10 mL via INTRA_ARTERIAL

## 2022-07-25 MED ORDER — ACETAMINOPHEN 325 MG PO TABS: 650.0000 mg | ORAL_TABLET | ORAL | Status: DC | PRN

## 2022-07-25 MED ORDER — ONDANSETRON HCL 4 MG/2ML IJ SOLN: 4.0000 mg | Freq: Four times a day (QID) | INTRAMUSCULAR | Status: DC | PRN

## 2022-07-25 MED ORDER — ASPIRIN 81 MG PO CHEW: 81.0000 mg | CHEWABLE_TABLET | ORAL | Status: DC

## 2022-07-25 SURGICAL SUPPLY — 11 items
BAND CMPR LRG ZPHR (HEMOSTASIS) ×1
BAND ZEPHYR COMPRESS 30 LONG (HEMOSTASIS) IMPLANT
CATH OPTITORQUE TIG 4.0 5F (CATHETERS) IMPLANT
GLIDESHEATH SLEND SS 6F .021 (SHEATH) IMPLANT
GUIDEWIRE INQWIRE 1.5J.035X260 (WIRE) IMPLANT
INQWIRE 1.5J .035X260CM (WIRE) ×1
KIT HEART LEFT (KITS) ×1 IMPLANT
PACK CARDIAC CATHETERIZATION (CUSTOM PROCEDURE TRAY) ×1 IMPLANT
SHEATH PROBE COVER 6X72 (BAG) IMPLANT
TRANSDUCER W/STOPCOCK (MISCELLANEOUS) ×1 IMPLANT
TUBING CIL FLEX 10 FLL-RA (TUBING) ×1 IMPLANT

## 2022-07-25 NOTE — Interval H&P Note (Signed)
Cath Lab Visit (complete for each Cath Lab visit)  Clinical Evaluation Leading to the Procedure:   ACS: No.  Non-ACS:    Anginal Classification: CCS II  Anti-ischemic medical therapy: Minimal Therapy (1 class of medications)  Non-Invasive Test Results: Intermediate-risk stress test findings: cardiac mortality 1-3%/year  Prior CABG: No previous CABG      History and Physical Interval Note:  07/25/2022 10:03 AM  Charles Esparza  has presented today for surgery, with the diagnosis of chest pain.  The various methods of treatment have been discussed with the patient and family. After consideration of risks, benefits and other options for treatment, the patient has consented to  Procedure(s): LEFT HEART CATH AND CORONARY ANGIOGRAPHY (N/A) as a surgical intervention.  The patient's history has been reviewed, patient examined, no change in status, stable for surgery.  I have reviewed the patient's chart and labs.  Questions were answered to the patient's satisfaction.     Charles Esparza

## 2022-07-25 NOTE — Discharge Instructions (Signed)
Radial Site Care  This sheet gives you information about how to care for yourself after your procedure. Your health care provider may also give you more specific instructions. If you have problems or questions, contact your health care provider. What can I expect after the procedure? After the procedure, it is common to have: Bruising and tenderness at the catheter insertion area. Follow these instructions at home: Medicines Take over-the-counter and prescription medicines only as told by your health care provider. Insertion site care Follow instructions from your health care provider about how to take care of your insertion site. Make sure you: Wash your hands with soap and water before you remove your bandage (dressing). If soap and water are not available, use hand sanitizer. May remove dressing in 24 hours. Check your insertion site every day for signs of infection. Check for: Redness, swelling, or pain. Fluid or blood. Pus or a bad smell. Warmth. Do no take baths, swim, or use a hot tub for 5 days. You may shower 24-48 hours after the procedure. Remove the dressing and gently wash the site with plain soap and water. Pat the area dry with a clean towel. Do not rub the site. That could cause bleeding. Do not apply powder or lotion to the site. Activity  For 24 hours after the procedure, or as directed by your health care provider: Do not flex or bend the affected arm. Do not push or pull heavy objects with the affected arm. Do not drive yourself home from the hospital or clinic. You may drive 24 hours after the procedure. Do not operate machinery or power tools. KEEP ARM ELEVATED THE REMAINDER OF THE DAY. Do not push, pull or lift anything that is heavier than 10 lb for 5 days. Ask your health care provider when it is okay to: Return to work or school. Resume usual physical activities or sports. Resume sexual activity. General instructions If the catheter site starts to  bleed, raise your arm and put firm pressure on the site. If the bleeding does not stop, get help right away. This is a medical emergency. DRINK PLENTY OF FLUIDS FOR THE NEXT 2-3 DAYS. No alcohol consumption for 24 hours after receiving sedation. If you went home on the same day as your procedure, a responsible adult should be with you for the first 24 hours after you arrive home. Keep all follow-up visits as told by your health care provider. This is important. Contact a health care provider if: You have a fever. You have redness, swelling, or yellow drainage around your insertion site. Get help right away if: You have unusual pain at the radial site. The catheter insertion area swells very fast. The insertion area is bleeding, and the bleeding does not stop when you hold steady pressure on the area. Your arm or hand becomes pale, cool, tingly, or numb. These symptoms may represent a serious problem that is an emergency. Do not wait to see if the symptoms will go away. Get medical help right away. Call your local emergency services (911 in the [place] Do not drive yourself to the hospital. Summary After the procedure, it is common to have bruising and tenderness at the site. Follow instructions from your health care provider about how to take care of your radial site wound. Check the wound every day for signs of infection.  This information is not intended to replace advice given to you by your health care provider. Make sure you discuss any questions you have with   your health care provider. Document Revised: 10/09/2017 Document Reviewed: 10/09/2017 Elsevier Patient Education  2020 [employer]  

## 2022-07-26 ENCOUNTER — Encounter (HOSPITAL_COMMUNITY): Payer: Self-pay | Admitting: Cardiovascular Disease

## 2022-07-27 DIAGNOSIS — E785 Hyperlipidemia, unspecified: Secondary | ICD-10-CM | POA: Diagnosis not present

## 2022-07-27 DIAGNOSIS — E119 Type 2 diabetes mellitus without complications: Secondary | ICD-10-CM | POA: Diagnosis not present

## 2022-07-27 DIAGNOSIS — Z6841 Body Mass Index (BMI) 40.0 and over, adult: Secondary | ICD-10-CM | POA: Diagnosis not present

## 2022-07-27 DIAGNOSIS — R202 Paresthesia of skin: Secondary | ICD-10-CM | POA: Diagnosis not present

## 2022-07-27 DIAGNOSIS — Z713 Dietary counseling and surveillance: Secondary | ICD-10-CM | POA: Diagnosis not present

## 2022-07-27 DIAGNOSIS — Z0000 Encounter for general adult medical examination without abnormal findings: Secondary | ICD-10-CM | POA: Diagnosis not present

## 2022-07-27 DIAGNOSIS — E1165 Type 2 diabetes mellitus with hyperglycemia: Secondary | ICD-10-CM | POA: Diagnosis not present

## 2022-07-31 ENCOUNTER — Telehealth: Payer: Self-pay

## 2022-07-31 DIAGNOSIS — I10 Essential (primary) hypertension: Secondary | ICD-10-CM

## 2022-07-31 MED ORDER — METOPROLOL TARTRATE 50 MG PO TABS: ORAL_TABLET | ORAL | 2 refills | Status: AC

## 2022-07-31 NOTE — Telephone Encounter (Signed)
-----   Message from Garwin Brothers, MD sent at 07/25/2022  5:12 PM EST ----- Increase metoprolol by 25 mg in the morning only.  Let patient know to get in touch with [place] in about 2 weeks to see how he feels.  Copy primary care Garwin Brothers, MD 07/25/2022 5:12 PM

## 2022-08-20 ENCOUNTER — Encounter: Payer: Self-pay | Admitting: Cardiology

## 2022-08-20 ENCOUNTER — Ambulatory Visit: Payer: Medicare Other | Attending: Cardiology | Admitting: Cardiology

## 2022-08-20 VITALS — BP 144/86 | HR 80 | Ht 71.0 in | Wt 368.4 lb

## 2022-08-20 DIAGNOSIS — J431 Panlobular emphysema: Secondary | ICD-10-CM | POA: Diagnosis not present

## 2022-08-20 DIAGNOSIS — E088 Diabetes mellitus due to underlying condition with unspecified complications: Secondary | ICD-10-CM

## 2022-08-20 DIAGNOSIS — I2510 Atherosclerotic heart disease of native coronary artery without angina pectoris: Secondary | ICD-10-CM | POA: Diagnosis not present

## 2022-08-20 DIAGNOSIS — I10 Essential (primary) hypertension: Secondary | ICD-10-CM | POA: Diagnosis not present

## 2022-08-20 NOTE — Progress Notes (Signed)
Cardiology Office Note:    Date:  08/20/2022   ID:  Charles Esparza, DOB 1971/04/23, MRN [id number]  PCP:  Pcp, No  Cardiologist:  Garwin Brothers, MD   Referring MD: No ref. provider found    ASSESSMENT:    1. Coronary artery disease involving native coronary artery of native heart without angina pectoris   2. Essential hypertension   3. Panlobular emphysema (HCC)   4. Diabetes mellitus due to underlying condition with unspecified complications (HCC)   5. Morbid obesity (HCC)    PLAN:    In order of problems listed above:  Coronary artery disease: Nonobstructive in nature: Secondary prevention stressed with the patient.  Importance of compliance with diet medication stressed and he vocalized understanding.  He was advised to ambulate to the best of his ability.  Results of coronary angiography were discussed with her at length. Morbid obesity: Weight reduction stressed.  Diet emphasized and he promises to do better. Essential hypertension: Blood pressure stable and diet was emphasized.  Lifestyle modifications stressed. Morbid obesity and uncontrolled diabetes mellitus: I discussed with him the significant consequences of uncontrolled obesity and diabetes mellitus.  He understands and promises to do better. Patient will be seen in follow-up appointment in 6 months or earlier if the patient has any concerns    Medication Adjustments/Labs and Tests Ordered: Current medicines are reviewed at length with the patient today.  Concerns regarding medicines are outlined above.  No orders of the defined types were placed in this encounter.  No orders of the defined types were placed in this encounter.    No chief complaint on file.    History of Present Illness:    Charles Esparza is a 51 y.o. male.  Patient has past medical history of coronary artery disease, essential hypertension, mixed dyslipidemia and diabetes mellitus.  He underwent evaluation with coronary angiography and  was found to have nonobstructive coronary artery disease.  He is morbidly obese.  He denies any chest pain orthopnea or PND.  He is happy about the results of the test.  At the time of my evaluation, the patient is alert awake oriented and in no distress.  Past Medical History:  Diagnosis Date   Angina pectoris (HCC) 07/20/2022   Asthma    CAD (coronary artery disease) 07/20/2022   Cardiac murmur 06/25/2022   COPD (chronic obstructive pulmonary disease) (HCC)    Diabetes mellitus due to underlying condition with unspecified complications (HCC) 09/29/2018   Dyspnea on exertion 10/02/2018   Essential hypertension 09/29/2018   Morbid obesity (HCC) 09/29/2018   Pain in left knee 02/08/2022   Palpitations 06/25/2022    Past Surgical History:  Procedure Laterality Date   LEFT HEART CATH AND CORONARY ANGIOGRAPHY N/A 10/02/2018   Procedure: LEFT HEART CATH AND CORONARY ANGIOGRAPHY;  Surgeon: Yvonne Kendall, MD;  Location: [facility];  Service: Cardiovascular;  Laterality: N/A;   LEFT HEART CATH AND CORONARY ANGIOGRAPHY N/A 07/25/2022   Procedure: LEFT HEART CATH AND CORONARY ANGIOGRAPHY;  Surgeon: Lennette Bihari, MD;  Location: [facility];  Service: Cardiovascular;  Laterality: N/A;    Current Medications: Current Meds  Medication Sig   acetaminophen (TYLENOL) 500 MG tablet Take 1,000 mg by mouth daily as needed for moderate pain or headache.   albuterol (PROVENTIL) (2.5 MG/3ML) 0.083% nebulizer solution USE 1 VIAL IN NEBULIZER THREE TIMES DAILY (Patient taking differently: Take 2.5 mg by nebulization as needed for wheezing or shortness of breath.)  albuterol (VENTOLIN HFA) 108 (90 Base) MCG/ACT inhaler Inhale 2 puffs into the lungs as needed for wheezing or shortness of breath.   amLODipine (NORVASC) 5 MG tablet Take 1 tablet (5 mg total) by mouth daily.   aspirin EC 81 MG tablet Take 81 mg by mouth daily.   atorvastatin (LIPITOR) 20 MG tablet Take 20 mg by mouth daily.    cetirizine (ZYRTEC) 10 MG tablet Take 10 mg by mouth daily.   fluticasone (FLONASE) 50 MCG/ACT nasal spray Place 1 spray into both nostrils daily as needed for allergies or rhinitis.   furosemide (LASIX) 40 MG tablet Take 40 mg by mouth as needed for fluid or edema.   gabapentin (NEURONTIN) 300 MG capsule Take 300 mg by mouth 2 (two) times daily.   glipiZIDE (GLUCOTROL) 10 MG tablet Take 20 mg by mouth daily before breakfast.   losartan (COZAAR) 25 MG tablet Take 25 mg by mouth every morning.   meloxicam (MOBIC) 15 MG tablet TAKE 1 TABLET (15 MG TOTAL) BY MOUTH DAILY.   metFORMIN (GLUCOPHAGE) 850 MG tablet Take 850 mg by mouth 2 (two) times daily.   metoprolol tartrate (LOPRESSOR) 50 MG tablet Take 75 mg (1 1/2 tablet) in the morning and 50 mg (1 tablet) in the evening.   naproxen sodium (ALEVE) 220 MG tablet Take 220 mg by mouth daily.   nitroGLYCERIN (NITROSTAT) 0.4 MG SL tablet Place 0.4 mg under the tongue every 5 (five) minutes as needed for chest pain.   oxymetazoline (AFRIN) 0.05 % nasal spray Place 2 sprays into both nostrils 2 (two) times daily as needed for congestion.   sildenafil (REVATIO) 20 MG tablet TAKE 2-5 TABLETS BY MOUTH AS NEEDED PRIOR TO SEXUAL ACTIVITY   Current Facility-Administered Medications for the 08/20/22 encounter (Office Visit) with Annette Liotta, Aundra Dubin, MD  Medication   0.9 %  sodium chloride infusion   bebtelovimab EUA injection SOLN 175 mg     Allergies:   Patient has no known allergies.   Social History   Socioeconomic History   Marital status: Single    Spouse name: Not on file   Number of children: Not on file   Years of education: Not on file   Highest education level: Not on file  Occupational History   Not on file  Tobacco Use   Smoking status: Never   Smokeless tobacco: Never  Substance and Sexual Activity   Alcohol use: Not on file   Drug use: Not on file   Sexual activity: Not on file  Other Topics Concern   Not on file  Social History  Narrative   Not on file   Social Determinants of Health   Financial Resource Strain: Not on file  Food Insecurity: Not on file  Transportation Needs: Not on file  Physical Activity: Not on file  Stress: Not on file  Social Connections: Not on file     Family History: The patient's family history includes Cancer in his mother; Heart disease in his father and mother; Hypertension in his father and mother. There is no history of Diabetes.  ROS:   Please see the history of present illness.    All other systems reviewed and are negative.  EKGs/Labs/Other Studies Reviewed:    The following studies were reviewed today: Coronary angiography report was discussed with the patient at length.   Recent Labs: 07/20/2022: BUN 13; Creatinine, Ser 0.64; Hemoglobin 15.0; Platelets 230; Potassium 4.5; Sodium 136  Recent Lipid Panel No results found for: "  CHOL", "TRIG", "HDL", "CHOLHDL", "VLDL", "LDLCALC", "LDLDIRECT"  Physical Exam:    VS:  BP (!) 144/86   Pulse 80   Ht 5\' 11"  (1.803 m)   Wt (!) 368 lb 6.4 oz (167.1 kg)   SpO2 96%   BMI 51.38 kg/m     Wt Readings from Last 3 Encounters:  08/20/22 (!) 368 lb 6.4 oz (167.1 kg)  07/25/22 (!) 363 lb (164.7 kg)  07/20/22 (!) 365 lb (165.6 kg)     GEN: Patient is in no acute distress HEENT: Normal NECK: No JVD; No carotid bruits LYMPHATICS: No lymphadenopathy CARDIAC: Hear sounds regular, 2/6 systolic murmur at the apex. RESPIRATORY:  Clear to auscultation without rales, wheezing or rhonchi  ABDOMEN: Soft, non-tender, non-distended MUSCULOSKELETAL:  No edema; No deformity  SKIN: Warm and dry NEUROLOGIC:  Alert and oriented x 3 PSYCHIATRIC:  Normal affect   Signed, 13/03/23, MD  08/20/2022 3:16 PM    [facility]

## 2022-08-20 NOTE — Patient Instructions (Signed)

## 2022-08-27 ENCOUNTER — Other Ambulatory Visit: Payer: Self-pay | Admitting: Physician Assistant

## 2022-10-04 ENCOUNTER — Other Ambulatory Visit: Payer: Self-pay | Admitting: Physician Assistant

## 2023-03-06 DIAGNOSIS — E785 Hyperlipidemia, unspecified: Secondary | ICD-10-CM | POA: Diagnosis not present

## 2023-03-06 DIAGNOSIS — Z1331 Encounter for screening for depression: Secondary | ICD-10-CM | POA: Diagnosis not present

## 2023-03-06 DIAGNOSIS — Z9181 History of falling: Secondary | ICD-10-CM | POA: Diagnosis not present

## 2023-03-06 DIAGNOSIS — D582 Other hemoglobinopathies: Secondary | ICD-10-CM | POA: Diagnosis not present

## 2023-03-06 DIAGNOSIS — E559 Vitamin D deficiency, unspecified: Secondary | ICD-10-CM | POA: Diagnosis not present

## 2023-03-31 IMAGING — DX DG CHEST 1V PORT
2 series · 2 of 2 positions shown · non-contrast
Comparison: September 29, 2018

CLINICAL DATA: 8RWLO-KB positive.  Shortness of breath.  Fever.

EXAM:
PORTABLE CHEST 1 VIEW

[chest ap (1 of 2)]
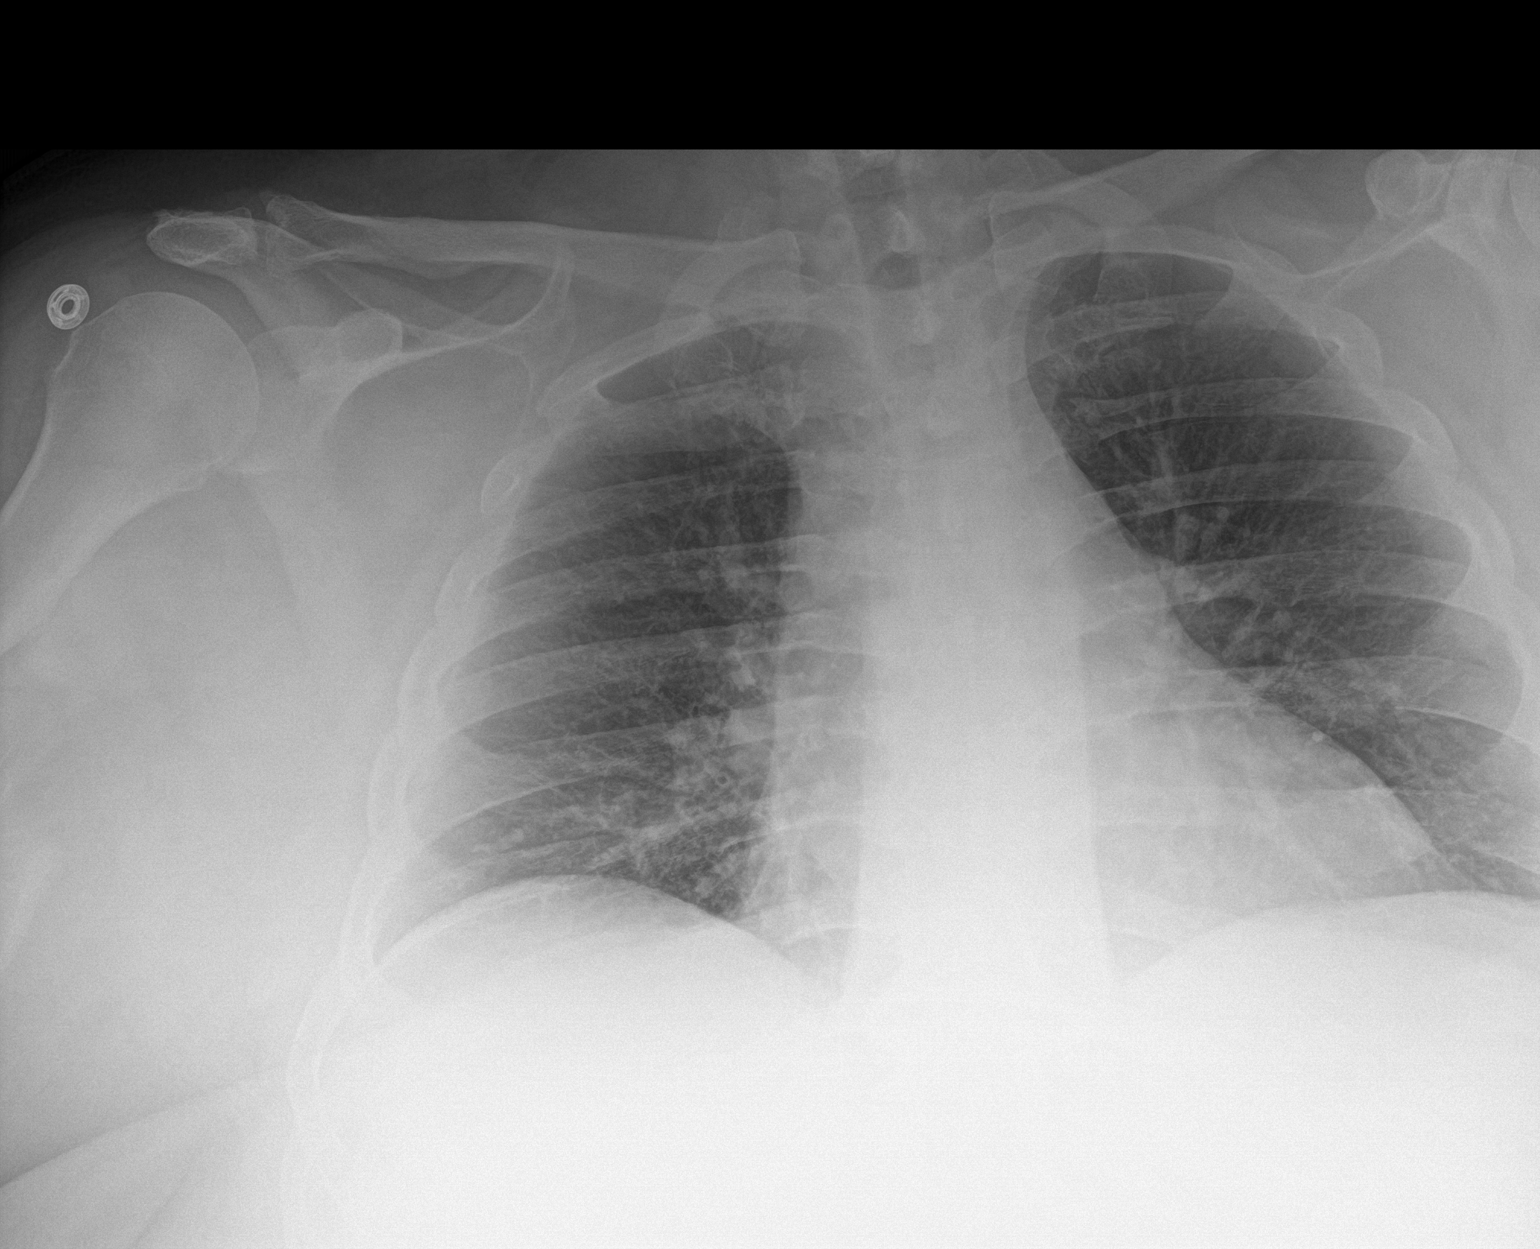

[chest ap (2 of 2)]
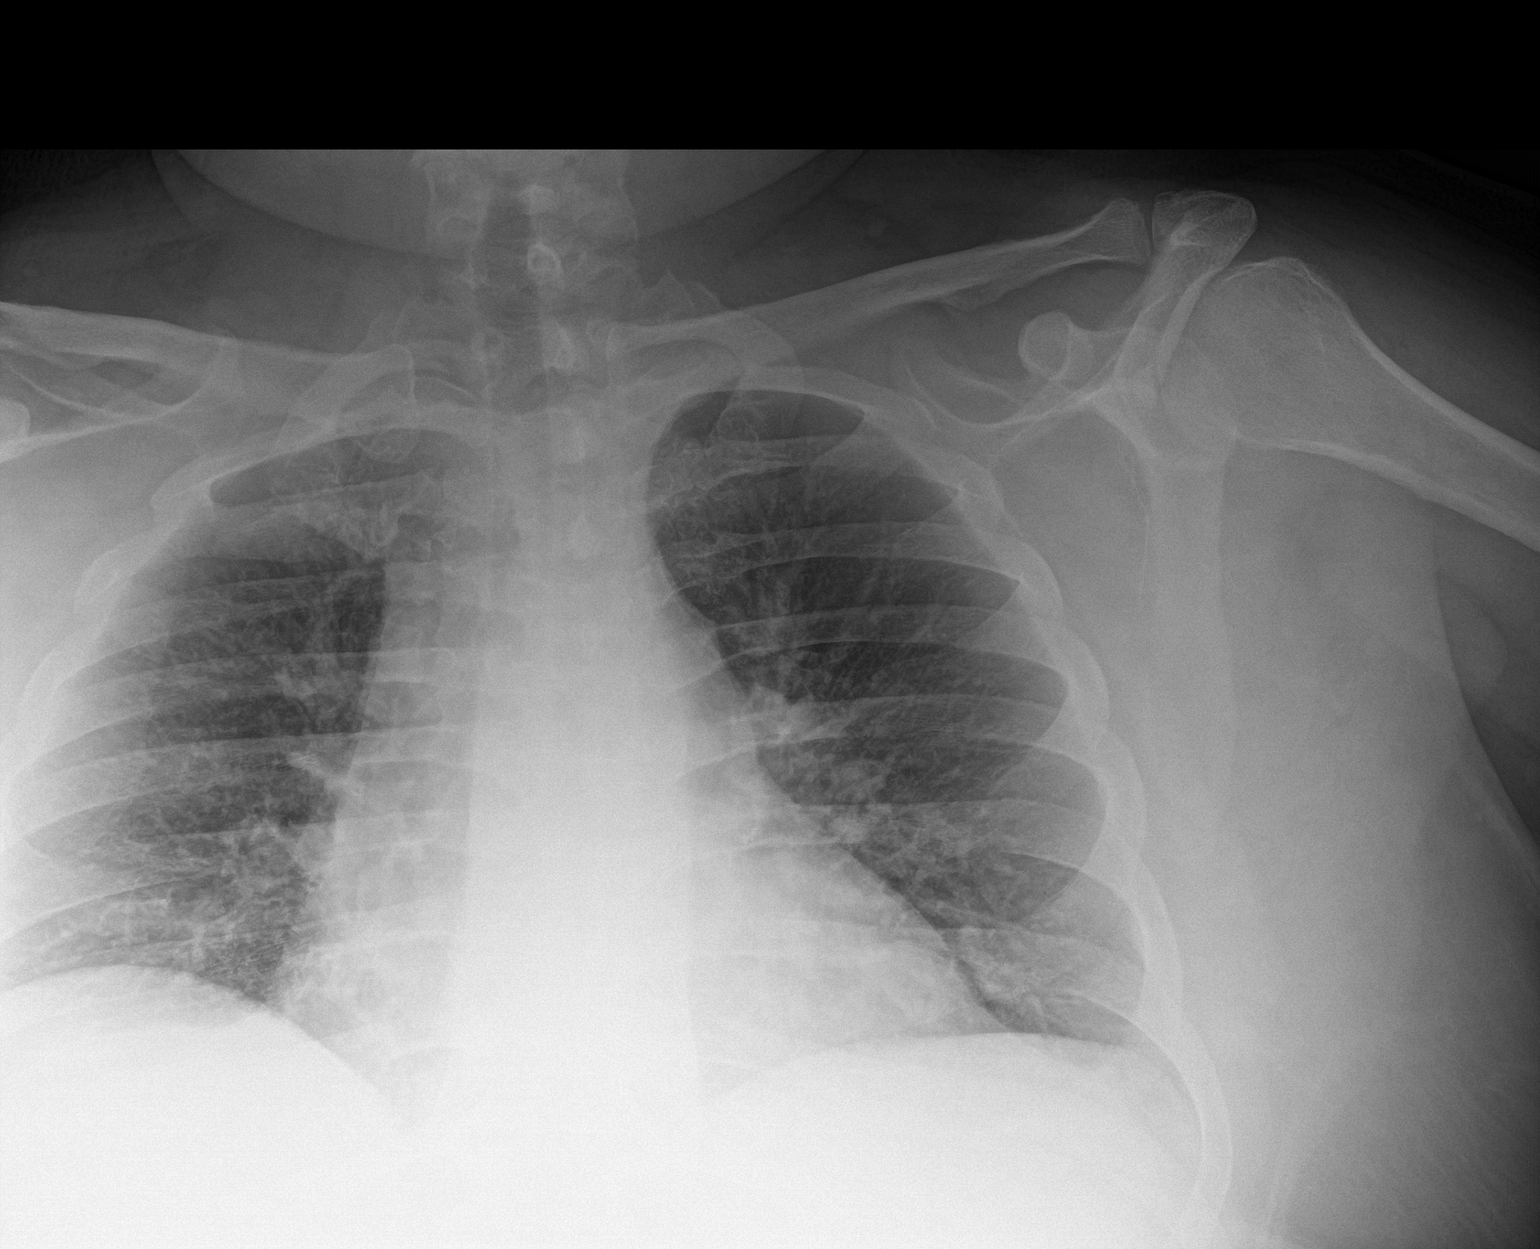

[2 of 2 positions shown; findings below may reference images not displayed]

FINDINGS: The heart size and mediastinal contours are within normal limits.
Both lungs are clear. The visualized skeletal structures are
unremarkable.
IMPRESSION: No active disease.

## 2023-04-16 ENCOUNTER — Ambulatory Visit: Payer: Medicare Other | Admitting: Physician Assistant

## 2023-05-23 DIAGNOSIS — Z139 Encounter for screening, unspecified: Secondary | ICD-10-CM | POA: Diagnosis not present

## 2023-05-23 DIAGNOSIS — Z1331 Encounter for screening for depression: Secondary | ICD-10-CM | POA: Diagnosis not present

## 2023-05-23 DIAGNOSIS — Z0000 Encounter for general adult medical examination without abnormal findings: Secondary | ICD-10-CM | POA: Diagnosis not present

## 2023-05-23 DIAGNOSIS — Z9181 History of falling: Secondary | ICD-10-CM | POA: Diagnosis not present

## 2023-06-07 DIAGNOSIS — E785 Hyperlipidemia, unspecified: Secondary | ICD-10-CM | POA: Diagnosis not present

## 2023-06-07 DIAGNOSIS — I10 Essential (primary) hypertension: Secondary | ICD-10-CM | POA: Diagnosis not present

## 2023-06-07 DIAGNOSIS — E1169 Type 2 diabetes mellitus with other specified complication: Secondary | ICD-10-CM | POA: Diagnosis not present

## 2023-06-07 DIAGNOSIS — S81802A Unspecified open wound, left lower leg, initial encounter: Secondary | ICD-10-CM | POA: Diagnosis not present

## 2023-06-12 DIAGNOSIS — B078 Other viral warts: Secondary | ICD-10-CM | POA: Diagnosis not present

## 2023-06-12 DIAGNOSIS — D2372 Other benign neoplasm of skin of left lower limb, including hip: Secondary | ICD-10-CM | POA: Diagnosis not present

## 2023-06-12 DIAGNOSIS — L578 Other skin changes due to chronic exposure to nonionizing radiation: Secondary | ICD-10-CM | POA: Diagnosis not present

## 2023-06-12 DIAGNOSIS — L821 Other seborrheic keratosis: Secondary | ICD-10-CM | POA: Diagnosis not present

## 2023-06-12 DIAGNOSIS — B353 Tinea pedis: Secondary | ICD-10-CM | POA: Diagnosis not present

## 2023-06-12 DIAGNOSIS — L814 Other melanin hyperpigmentation: Secondary | ICD-10-CM | POA: Diagnosis not present

## 2023-07-08 DIAGNOSIS — E661 Drug-induced obesity: Secondary | ICD-10-CM | POA: Diagnosis not present

## 2023-07-29 ENCOUNTER — Other Ambulatory Visit: Payer: Self-pay | Admitting: Cardiovascular Disease

## 2023-09-29 DIAGNOSIS — Z6841 Body Mass Index (BMI) 40.0 and over, adult: Secondary | ICD-10-CM | POA: Diagnosis not present

## 2023-09-29 DIAGNOSIS — E7800 Pure hypercholesterolemia, unspecified: Secondary | ICD-10-CM | POA: Diagnosis not present

## 2023-09-29 DIAGNOSIS — I4891 Unspecified atrial fibrillation: Secondary | ICD-10-CM | POA: Diagnosis not present

## 2023-09-29 DIAGNOSIS — M1990 Unspecified osteoarthritis, unspecified site: Secondary | ICD-10-CM | POA: Diagnosis not present

## 2023-09-29 DIAGNOSIS — I2510 Atherosclerotic heart disease of native coronary artery without angina pectoris: Secondary | ICD-10-CM | POA: Diagnosis not present

## 2023-09-29 DIAGNOSIS — G459 Transient cerebral ischemic attack, unspecified: Secondary | ICD-10-CM | POA: Diagnosis not present

## 2023-09-29 DIAGNOSIS — I10 Essential (primary) hypertension: Secondary | ICD-10-CM | POA: Diagnosis not present

## 2023-09-29 DIAGNOSIS — D509 Iron deficiency anemia, unspecified: Secondary | ICD-10-CM | POA: Diagnosis not present

## 2023-09-29 DIAGNOSIS — A419 Sepsis, unspecified organism: Secondary | ICD-10-CM | POA: Diagnosis not present

## 2023-09-29 DIAGNOSIS — E119 Type 2 diabetes mellitus without complications: Secondary | ICD-10-CM | POA: Diagnosis not present

## 2023-09-30 DIAGNOSIS — E7800 Pure hypercholesterolemia, unspecified: Secondary | ICD-10-CM | POA: Diagnosis not present

## 2023-09-30 DIAGNOSIS — D509 Iron deficiency anemia, unspecified: Secondary | ICD-10-CM | POA: Diagnosis not present

## 2023-09-30 DIAGNOSIS — G459 Transient cerebral ischemic attack, unspecified: Secondary | ICD-10-CM | POA: Diagnosis not present

## 2023-09-30 DIAGNOSIS — M1990 Unspecified osteoarthritis, unspecified site: Secondary | ICD-10-CM | POA: Diagnosis not present

## 2023-09-30 DIAGNOSIS — I517 Cardiomegaly: Secondary | ICD-10-CM | POA: Diagnosis not present

## 2023-09-30 DIAGNOSIS — Z6841 Body Mass Index (BMI) 40.0 and over, adult: Secondary | ICD-10-CM | POA: Diagnosis not present

## 2023-09-30 DIAGNOSIS — I10 Essential (primary) hypertension: Secondary | ICD-10-CM | POA: Diagnosis not present

## 2023-09-30 DIAGNOSIS — I2510 Atherosclerotic heart disease of native coronary artery without angina pectoris: Secondary | ICD-10-CM | POA: Diagnosis not present

## 2023-09-30 DIAGNOSIS — E119 Type 2 diabetes mellitus without complications: Secondary | ICD-10-CM | POA: Diagnosis not present

## 2023-09-30 DIAGNOSIS — R29898 Other symptoms and signs involving the musculoskeletal system: Secondary | ICD-10-CM | POA: Diagnosis not present

## 2023-09-30 DIAGNOSIS — I4891 Unspecified atrial fibrillation: Secondary | ICD-10-CM | POA: Diagnosis not present

## 2023-10-03 DIAGNOSIS — D509 Iron deficiency anemia, unspecified: Secondary | ICD-10-CM | POA: Diagnosis not present

## 2023-10-03 DIAGNOSIS — E785 Hyperlipidemia, unspecified: Secondary | ICD-10-CM | POA: Diagnosis not present

## 2023-10-03 DIAGNOSIS — M109 Gout, unspecified: Secondary | ICD-10-CM | POA: Diagnosis not present

## 2023-10-03 DIAGNOSIS — G459 Transient cerebral ischemic attack, unspecified: Secondary | ICD-10-CM | POA: Diagnosis not present

## 2023-10-03 DIAGNOSIS — E661 Drug-induced obesity: Secondary | ICD-10-CM | POA: Diagnosis not present

## 2023-10-04 ENCOUNTER — Other Ambulatory Visit: Payer: Self-pay | Admitting: Nurse Practitioner

## 2023-10-04 DIAGNOSIS — G459 Transient cerebral ischemic attack, unspecified: Secondary | ICD-10-CM

## 2023-10-07 ENCOUNTER — Emergency Department (HOSPITAL_COMMUNITY)
Admission: EM | Admit: 2023-10-07 | Discharge: 2023-10-07 | Disposition: A | Discharge status: Home / Self Care | Payer: Medicare HMO | Attending: Emergency Medicine | Admitting: Emergency Medicine

## 2023-10-07 ENCOUNTER — Other Ambulatory Visit: Payer: Self-pay

## 2023-10-07 ENCOUNTER — Encounter (HOSPITAL_COMMUNITY): Payer: Self-pay

## 2023-10-07 DIAGNOSIS — R42 Dizziness and giddiness: Secondary | ICD-10-CM | POA: Diagnosis not present

## 2023-10-07 DIAGNOSIS — Z7982 Long term (current) use of aspirin: Secondary | ICD-10-CM | POA: Diagnosis not present

## 2023-10-07 DIAGNOSIS — Z7901 Long term (current) use of anticoagulants: Secondary | ICD-10-CM | POA: Diagnosis not present

## 2023-10-07 DIAGNOSIS — Z79899 Other long term (current) drug therapy: Secondary | ICD-10-CM | POA: Insufficient documentation

## 2023-10-07 DIAGNOSIS — I10 Essential (primary) hypertension: Secondary | ICD-10-CM | POA: Insufficient documentation

## 2023-10-07 LAB — CBC
HCT: 42.1 % (ref 39.0–52.0)
Hemoglobin: 13.9 g/dL (ref 13.0–17.0)
MCH: 27.6 pg (ref 26.0–34.0)
MCHC: 33 g/dL (ref 30.0–36.0)
MCV: 83.5 fL (ref 80.0–100.0)
Platelets: 253 10*3/uL (ref 150–400)
RBC: 5.04 MIL/uL (ref 4.22–5.81)
RDW: 13.2 % (ref 11.5–15.5)
WBC: 8.6 10*3/uL (ref 4.0–10.5)
nRBC: 0 % (ref 0.0–0.2)

## 2023-10-07 LAB — URINALYSIS, ROUTINE W REFLEX MICROSCOPIC
Bilirubin Urine: NEGATIVE
Glucose, UA: >=500 mg/dL — AB
Hgb urine dipstick: NEGATIVE
Ketones, ur: 5 mg/dL — AB
Leukocytes,Ua: NEGATIVE
Nitrite: NEGATIVE
Protein, ur: NEGATIVE mg/dL
Specific Gravity, Urine: 1.02 (ref 1.005–1.030)
pH: 6 (ref 5.0–8.0)

## 2023-10-07 LAB — BASIC METABOLIC PANEL
Anion gap: 11 (ref 5–15)
BUN: 13 mg/dL (ref 6–20)
CO2: 26 mmol/L (ref 22–32)
Calcium: 9.7 mg/dL (ref 8.9–10.3)
Chloride: 100 mmol/L (ref 98–111)
Creatinine, Ser: 0.77 mg/dL (ref 0.61–1.24)
GFR, Estimated: >60 mL/min (ref >60)
Glucose, Bld: 223 mg/dL — ABNORMAL HIGH (ref 70–99)
Potassium: 4.1 mmol/L (ref 3.5–5.1)
Sodium: 137 mmol/L (ref 135–145)

## 2023-10-07 LAB — TROPONIN I (HIGH SENSITIVITY): Troponin I (High Sensitivity): 7 ng/L (ref <18)

## 2023-10-07 LAB — CBG MONITORING, ED: Glucose-Capillary: 207 mg/dL — ABNORMAL HIGH (ref 70–99)

## 2023-10-07 NOTE — ED Notes (Signed)
Patient verbalizes understanding of discharge instructions. Opportunity for questioning and answers were provided. Armband removed by staff, pt discharged from ED. Pt ambulatory to ED waiting room with steady gait.  

## 2023-10-07 NOTE — ED Triage Notes (Signed)
Pt arrives with c/o hypertension, lightheadedness, and CP. Pt has hx of HTN. Pt was seen last week for similar symptoms and worked up for TIA. Pt has an appt for outpt MRI in March. Pt denies focal weakness, slurred speech, or facial droop. Pt reports headache also.

## 2023-10-07 NOTE — ED Provider Notes (Signed)
[facility] EMERGENCY DEPARTMENT AT [facility] Provider Note   CSN: [id number] Arrival date & time: 10/07/23  1648     History  Chief Complaint  Patient presents with   Hypertension    Charles Esparza is a 53 y.o. male presented to ED with concern for high blood pressure and lightheadedness.  Patient ports he was admitted to [facility] about a week ago and diagnosed with a potential TIA, although he was not able to get an MRI due to his large weight and body size.  He was started on aspirin and Plavix.  He says at that time he was having lightheadedness and left arm tingling.  He has had recurring episodes of mild headache and lightheadedness all week, but no left arm tingling.  He another episode today while at rest.  He says he feels like he is going to pass out or that he has tunnel vision.  He denies loss of vision, he reports he currently feels back to normal.  He reports his blood pressure was high when this episode occurred.  He does have a history of hypertension, hyperlipidemia.  He also reports he had had a brief episode of sharp chest pain which resolved.  HPI     Home Medications Prior to Admission medications   Medication Sig Start Date End Date Taking? Authorizing Provider  acetaminophen (TYLENOL) 500 MG tablet Take 500-1,000 mg by mouth daily as needed for moderate pain (pain score 4-6) or headache.   Yes [provider]  albuterol (PROVENTIL) (2.5 MG/3ML) 0.083% nebulizer solution USE 1 VIAL IN NEBULIZER THREE TIMES DAILY Patient taking differently: Take 2.5 mg by nebulization as needed for wheezing or shortness of breath. 05/10/20  Yes Abigail Miyamoto, MD  albuterol (VENTOLIN HFA) 108 (90 Base) MCG/ACT inhaler Inhale 2 puffs into the lungs as needed for wheezing or shortness of breath. 07/30/22  Yes [provider]  amLODipine (NORVASC) 5 MG tablet Take 1 tablet by mouth once daily 07/29/23  Yes Revankar, Aundra Dubin, MD  aspirin EC  81 MG tablet Take 81 mg by mouth daily.   Yes [provider]  atorvastatin (LIPITOR) 20 MG tablet Take 20 mg by mouth daily. 04/06/22  Yes [provider]  cetirizine (ZYRTEC) 10 MG tablet Take 10 mg by mouth daily.   Yes [provider]  clopidogrel (PLAVIX) 75 MG tablet Take 75 mg by mouth daily. 09/30/23  Yes [provider]  fluticasone (FLONASE) 50 MCG/ACT nasal spray Place 1 spray into both nostrils daily as needed for allergies or rhinitis. 04/07/22  Yes [provider]  furosemide (LASIX) 40 MG tablet Take 40 mg by mouth as needed for fluid or edema.   Yes [provider]  gabapentin (NEURONTIN) 300 MG capsule Take 300 mg by mouth daily. 07/27/22  Yes [provider]  glipiZIDE (GLUCOTROL XL) 10 MG 24 hr tablet Take 20 mg by mouth every morning. 07/30/23  Yes [provider]  losartan (COZAAR) 25 MG tablet Take 25 mg by mouth every morning. 04/06/22  Yes [provider]  meloxicam (MOBIC) 15 MG tablet TAKE 1 TABLET (15 MG TOTAL) BY MOUTH DAILY. 06/15/22  Yes Persons, [place], [place]  metFORMIN (GLUCOPHAGE) 850 MG tablet Take 1,700 mg by mouth daily. May take 1 additional tablet in the evening 07/27/22  Yes [provider]  metoprolol tartrate (LOPRESSOR) 50 MG tablet Take 75 mg (1 1/2 tablet) in the morning and 50 mg (1 tablet)  in the evening. Patient taking differently: Take 50 mg by mouth daily. 07/31/22  Yes Revankar, Aundra Dubin, MD  montelukast (SINGULAIR) 10 MG tablet Take 10 mg by mouth daily. 07/29/23  Yes [provider]  nitroGLYCERIN (NITROSTAT) 0.4 MG SL tablet Place 0.4 mg under the tongue every 5 (five) minutes as needed for chest pain.   Yes [provider]  oxymetazoline (AFRIN) 0.05 % nasal spray Place 2 sprays into both nostrils 2 (two) times daily as needed for congestion.   Yes [provider]  OZEMPIC, 0.25 OR 0.5 MG/DOSE, 2 MG/3ML SOPN Inject 0.5 mg into the skin  every Friday. 09/14/23  Yes [provider]  potassium chloride (MICRO-K) 10 MEQ CR capsule Take 10 mEq by mouth daily as needed. 10/03/23  Yes [provider]      Allergies    Patient has no known allergies.    Review of Systems   Review of Systems  Physical Exam Updated Vital Signs BP (!) 146/78   Pulse 92   Temp 99 F (37.2 C) (Oral)   Resp 16   Wt (!) 166.9 kg   SpO2 98%   BMI 51.33 kg/m  Physical Exam Constitutional:      General: He is not in acute distress.    Appearance: He is obese.  HENT:     Head: Normocephalic and atraumatic.  Eyes:     Conjunctiva/sclera: Conjunctivae normal.     Pupils: Pupils are equal, round, and reactive to light.  Cardiovascular:     Rate and Rhythm: Normal rate and regular rhythm.  Pulmonary:     Effort: Pulmonary effort is normal. No respiratory distress.  Abdominal:     General: There is no distension.     Tenderness: There is no abdominal tenderness.  Skin:    General: Skin is warm and dry.  Neurological:     General: No focal deficit present.     Mental Status: He is alert and oriented to person, place, and time. Mental status is at baseline.  Psychiatric:        Mood and Affect: Mood normal.        Behavior: Behavior normal.     ED Results / Procedures / Treatments   Labs (all labs ordered are listed, but only abnormal results are displayed) Labs Reviewed  BASIC METABOLIC PANEL - Abnormal; Notable for the following components:      Result Value   Glucose, Bld 223 (*)    All other components within normal limits  URINALYSIS, ROUTINE W REFLEX MICROSCOPIC - Abnormal; Notable for the following components:   Glucose, UA >=500 (*)    Ketones, ur 5 (*)    Bacteria, UA RARE (*)    All other components within normal limits  CBG MONITORING, ED - Abnormal; Notable for the following components:   Glucose-Capillary 207 (*)    All other components within normal limits  CBC  TROPONIN I (HIGH SENSITIVITY)     EKG EKG Interpretation Date/Time:  Monday October 07 2023 17:41:44 EST Ventricular Rate:  93 PR Interval:  138 QRS Duration:  96 QT Interval:  346 QTC Calculation: 430 R Axis:   -20  Text Interpretation: Normal sinus rhythm When compared with ECG of 29-Dec-2003 15:43, PREVIOUS ECG IS PRESENT No significant changes Confirmed by Alvester Chou [phone]) on 10/07/2023 6:14:10 PM  Radiology No results found.  Procedures Procedures    Medications Ordered in ED Medications - No data to display  ED Course/ Medical  Decision Making/ A&P Clinical Course as of 10/07/23 2340  Mon Oct 07, 2023  2008 BP 130/70 at this time [MT]  2252 Patient has now been observed for 6 hours in the ED.  Blood pressure has normalized, I reviewed telemetry and there are no concerning arrhythmias.  At this time I think he is stable for discharge [MT]    Clinical Course User Index [MT] Sidney Kann, Kermit Balo, MD                                 Medical Decision Making Amount and/or Complexity of Data Reviewed Labs: ordered.   This patient presents to the ED with concern for lightheadedness, chest pain, sporadic. This involves an extensive number of treatment options, and is a complaint that carries with it a high risk of complications and morbidity.  The differential diagnosis includes labile blood pressure versus metabolic derangement versus atypical ACS versus other  Co-morbidities that complicate the patient evaluation: Cardiovascular risk factors as noted above  External records from outside source obtained and reviewed including -recent hospitalization records are not immediately available on Care Everywhere  I ordered and personally interpreted labs.  The pertinent results include: Emergent findings.  Troponin negative.   The patient was maintained on a cardiac monitor.  I personally viewed and interpreted the cardiac monitored which showed an underlying rhythm of: Normal sinus rhythm  Per my  interpretation the patient's ECG shows sinus rhythm no acute ischemic findings  I have reviewed the patients home medicines and have made adjustments as needed  Test Considered: Patient's exam and symptoms are not consistent with CVA or stroke or vascular emergency, nor aortic dissection or PE.  Do not see indication for CT angiogram imaging of the head neck or chest, or neuroimaging.  Doubt pneumothorax.  After the interventions noted above, I reevaluated the patient and found that they have: improved -patient is asymptomatic throughout his stay in the ED.  Blood pressure is normalized  Social Determinants of Health: patient was counseled about the importance of nicotine cessation.  He currently chews tobacco.  He drinks very minimal caffeine or alcohol.  Given the patient a recent hospitalization and full stroke workup including echocardiogram, as reported at [facility], I do not see an indication for repeat hospitalization for these lightheaded episodes.  This may be multifactorial.  Patient attributes some of this to anxiety given his recent TIA diagnosis.  This is 1 possibility.  I do not see evidence of PE, stroke, or other life-threatening condition at this time.  Low suspicion for aortic dissection or sepsis.  Given that he is asymptomatic I think it is reasonable to discharge.  He has follow-up already arranged  Dispostion:  After consideration of the diagnostic results and the patients response to treatment, I feel that the patent would benefit from close outpatient follow-up.          Final Clinical Impression(s) / ED Diagnoses Final diagnoses:  Lightheadedness  Hypertension, unspecified type    Rx / DC Orders ED Discharge Orders     None         Tela Kotecki, Kermit Balo, MD 10/07/23 2340

## 2023-10-23 DIAGNOSIS — G2581 Restless legs syndrome: Secondary | ICD-10-CM | POA: Diagnosis not present

## 2023-10-23 DIAGNOSIS — F411 Generalized anxiety disorder: Secondary | ICD-10-CM | POA: Diagnosis not present

## 2023-10-23 DIAGNOSIS — G629 Polyneuropathy, unspecified: Secondary | ICD-10-CM | POA: Diagnosis not present

## 2023-10-23 DIAGNOSIS — E119 Type 2 diabetes mellitus without complications: Secondary | ICD-10-CM | POA: Diagnosis not present

## 2023-10-23 DIAGNOSIS — I2510 Atherosclerotic heart disease of native coronary artery without angina pectoris: Secondary | ICD-10-CM | POA: Diagnosis not present

## 2023-10-28 ENCOUNTER — Encounter: Payer: Self-pay | Admitting: Internal Medicine

## 2023-11-17 ENCOUNTER — Ambulatory Visit
Admission: RE | Admit: 2023-11-17 | Discharge: 2023-11-17 | Disposition: A | Discharge status: Home / Self Care | Payer: Medicare HMO | Source: Ambulatory Visit | Attending: Nurse Practitioner | Admitting: Nurse Practitioner

## 2023-11-17 DIAGNOSIS — H05243 Constant exophthalmos, bilateral: Secondary | ICD-10-CM | POA: Diagnosis not present

## 2023-11-17 DIAGNOSIS — R200 Anesthesia of skin: Secondary | ICD-10-CM | POA: Diagnosis not present

## 2023-11-17 DIAGNOSIS — G459 Transient cerebral ischemic attack, unspecified: Secondary | ICD-10-CM

## 2023-11-17 MED ORDER — GADOPICLENOL 0.5 MMOL/ML IV SOLN
10.0000 mL | Freq: Once | INTRAVENOUS | Status: AC | PRN
  Administered 2023-11-17: 10 mL via INTRAVENOUS

## 2023-11-20 ENCOUNTER — Ambulatory Visit: Payer: Medicare HMO | Attending: Cardiology | Admitting: Cardiology

## 2023-12-31 DIAGNOSIS — E66813 Obesity, class 3: Secondary | ICD-10-CM | POA: Diagnosis not present

## 2023-12-31 DIAGNOSIS — E119 Type 2 diabetes mellitus without complications: Secondary | ICD-10-CM | POA: Diagnosis not present

## 2023-12-31 DIAGNOSIS — I10 Essential (primary) hypertension: Secondary | ICD-10-CM | POA: Diagnosis not present

## 2023-12-31 DIAGNOSIS — I2510 Atherosclerotic heart disease of native coronary artery without angina pectoris: Secondary | ICD-10-CM | POA: Diagnosis not present

## 2023-12-31 DIAGNOSIS — Z6841 Body Mass Index (BMI) 40.0 and over, adult: Secondary | ICD-10-CM | POA: Diagnosis not present

## 2023-12-31 DIAGNOSIS — Z8673 Personal history of transient ischemic attack (TIA), and cerebral infarction without residual deficits: Secondary | ICD-10-CM | POA: Diagnosis not present

## 2024-01-01 DIAGNOSIS — H5203 Hypermetropia, bilateral: Secondary | ICD-10-CM | POA: Diagnosis not present

## 2024-01-20 DIAGNOSIS — I2510 Atherosclerotic heart disease of native coronary artery without angina pectoris: Secondary | ICD-10-CM | POA: Diagnosis not present

## 2024-01-20 DIAGNOSIS — I340 Nonrheumatic mitral (valve) insufficiency: Secondary | ICD-10-CM | POA: Diagnosis not present

## 2024-01-20 DIAGNOSIS — I471 Supraventricular tachycardia, unspecified: Secondary | ICD-10-CM | POA: Diagnosis not present

## 2024-01-20 DIAGNOSIS — I10 Essential (primary) hypertension: Secondary | ICD-10-CM | POA: Diagnosis not present

## 2024-01-20 DIAGNOSIS — E785 Hyperlipidemia, unspecified: Secondary | ICD-10-CM | POA: Diagnosis not present

## 2024-01-24 DIAGNOSIS — Z87442 Personal history of urinary calculi: Secondary | ICD-10-CM | POA: Diagnosis not present

## 2024-01-24 DIAGNOSIS — I959 Hypotension, unspecified: Secondary | ICD-10-CM | POA: Diagnosis not present

## 2024-01-24 DIAGNOSIS — N390 Urinary tract infection, site not specified: Secondary | ICD-10-CM | POA: Diagnosis not present

## 2024-01-24 DIAGNOSIS — R7989 Other specified abnormal findings of blood chemistry: Secondary | ICD-10-CM | POA: Diagnosis not present

## 2024-02-04 DIAGNOSIS — E785 Hyperlipidemia, unspecified: Secondary | ICD-10-CM | POA: Diagnosis not present

## 2024-02-28 DIAGNOSIS — J4530 Mild persistent asthma, uncomplicated: Secondary | ICD-10-CM | POA: Diagnosis not present

## 2024-02-28 DIAGNOSIS — J069 Acute upper respiratory infection, unspecified: Secondary | ICD-10-CM | POA: Diagnosis not present

## 2024-02-28 DIAGNOSIS — Z6841 Body Mass Index (BMI) 40.0 and over, adult: Secondary | ICD-10-CM | POA: Diagnosis not present

## 2024-02-28 DIAGNOSIS — E66813 Obesity, class 3: Secondary | ICD-10-CM | POA: Diagnosis not present

## 2024-02-28 DIAGNOSIS — H6592 Unspecified nonsuppurative otitis media, left ear: Secondary | ICD-10-CM | POA: Diagnosis not present

## 2024-05-29 DIAGNOSIS — I10 Essential (primary) hypertension: Secondary | ICD-10-CM | POA: Diagnosis not present

## 2024-05-29 DIAGNOSIS — M273 Alveolitis of jaws: Secondary | ICD-10-CM | POA: Diagnosis not present

## 2024-05-29 DIAGNOSIS — E559 Vitamin D deficiency, unspecified: Secondary | ICD-10-CM | POA: Diagnosis not present

## 2024-05-29 DIAGNOSIS — G2581 Restless legs syndrome: Secondary | ICD-10-CM | POA: Diagnosis not present

## 2024-05-29 DIAGNOSIS — E1165 Type 2 diabetes mellitus with hyperglycemia: Secondary | ICD-10-CM | POA: Diagnosis not present

## 2024-05-29 DIAGNOSIS — J4530 Mild persistent asthma, uncomplicated: Secondary | ICD-10-CM | POA: Diagnosis not present

## 2024-05-29 DIAGNOSIS — I2510 Atherosclerotic heart disease of native coronary artery without angina pectoris: Secondary | ICD-10-CM | POA: Diagnosis not present

## 2024-05-29 DIAGNOSIS — E1169 Type 2 diabetes mellitus with other specified complication: Secondary | ICD-10-CM | POA: Diagnosis not present

## 2024-05-29 DIAGNOSIS — G629 Polyneuropathy, unspecified: Secondary | ICD-10-CM | POA: Diagnosis not present

## 2024-05-29 DIAGNOSIS — E785 Hyperlipidemia, unspecified: Secondary | ICD-10-CM | POA: Diagnosis not present

## 2024-07-16 DIAGNOSIS — E119 Type 2 diabetes mellitus without complications: Secondary | ICD-10-CM | POA: Diagnosis not present

## 2024-07-16 DIAGNOSIS — R1310 Dysphagia, unspecified: Secondary | ICD-10-CM | POA: Diagnosis not present

## 2024-07-16 DIAGNOSIS — R1084 Generalized abdominal pain: Secondary | ICD-10-CM | POA: Diagnosis not present

## 2024-07-16 DIAGNOSIS — R112 Nausea with vomiting, unspecified: Secondary | ICD-10-CM | POA: Diagnosis not present

## 2024-10-02 ENCOUNTER — Encounter: Payer: Self-pay | Admitting: Gastroenterology
